# Patient Record
Sex: Female | Born: 1970 | Race: White | Hispanic: No | Marital: Married | Smoking: Former smoker
Health system: Southern US, Community
[De-identification: ages and names within clinical notes are randomized; demographics above are authoritative.]

## PROBLEM LIST (undated history)

## (undated) DIAGNOSIS — E785 Hyperlipidemia, unspecified: Secondary | ICD-10-CM

## (undated) DIAGNOSIS — G902 Horner's syndrome: Secondary | ICD-10-CM

## (undated) DIAGNOSIS — I739 Peripheral vascular disease, unspecified: Secondary | ICD-10-CM

## (undated) DIAGNOSIS — F419 Anxiety disorder, unspecified: Secondary | ICD-10-CM

## (undated) DIAGNOSIS — I472 Ventricular tachycardia: Secondary | ICD-10-CM

## (undated) DIAGNOSIS — I4729 Other ventricular tachycardia: Secondary | ICD-10-CM

## (undated) DIAGNOSIS — I255 Ischemic cardiomyopathy: Secondary | ICD-10-CM

## (undated) DIAGNOSIS — Z87891 Personal history of nicotine dependence: Secondary | ICD-10-CM

## (undated) DIAGNOSIS — I2510 Atherosclerotic heart disease of native coronary artery without angina pectoris: Secondary | ICD-10-CM

## (undated) HISTORY — DX: Ventricular tachycardia: I47.2

## (undated) HISTORY — DX: Peripheral vascular disease, unspecified: I73.9

## (undated) HISTORY — DX: Ischemic cardiomyopathy: I25.5

## (undated) HISTORY — PX: ABDOMINAL HYSTERECTOMY: SHX81

## (undated) HISTORY — DX: Hyperlipidemia, unspecified: E78.5

## (undated) HISTORY — DX: Atherosclerotic heart disease of native coronary artery without angina pectoris: I25.10

## (undated) HISTORY — PX: CAROTID ARTERY - SUBCLAVIAN ARTERY BYPASS GRAFT: SUR178

## (undated) HISTORY — PX: CHOLECYSTECTOMY: SHX55

## (undated) HISTORY — DX: Horner's syndrome: G90.2

## (undated) HISTORY — DX: Personal history of nicotine dependence: Z87.891

## (undated) HISTORY — DX: Other ventricular tachycardia: I47.29

## (undated) HISTORY — DX: Anxiety disorder, unspecified: F41.9

---

## 2000-06-23 ENCOUNTER — Other Ambulatory Visit: Admission: RE | Admit: 2000-06-23 | Discharge: 2000-06-23 | Payer: Self-pay | Admitting: Obstetrics and Gynecology

## 2000-08-20 ENCOUNTER — Ambulatory Visit (HOSPITAL_COMMUNITY): Admission: RE | Admit: 2000-08-20 | Discharge: 2000-08-20 | Payer: Self-pay | Admitting: Obstetrics and Gynecology

## 2000-10-12 ENCOUNTER — Encounter (INDEPENDENT_AMBULATORY_CARE_PROVIDER_SITE_OTHER): Payer: Self-pay

## 2000-10-12 ENCOUNTER — Inpatient Hospital Stay (HOSPITAL_COMMUNITY): Admission: RE | Admit: 2000-10-12 | Discharge: 2000-10-14 | Payer: Self-pay | Admitting: Obstetrics and Gynecology

## 2001-04-16 ENCOUNTER — Other Ambulatory Visit: Admission: RE | Admit: 2001-04-16 | Discharge: 2001-04-16 | Payer: Self-pay | Admitting: Obstetrics and Gynecology

## 2001-05-12 ENCOUNTER — Encounter (INDEPENDENT_AMBULATORY_CARE_PROVIDER_SITE_OTHER): Payer: Self-pay

## 2001-05-12 ENCOUNTER — Inpatient Hospital Stay (HOSPITAL_COMMUNITY): Admission: RE | Admit: 2001-05-12 | Discharge: 2001-05-14 | Payer: Self-pay | Admitting: Obstetrics and Gynecology

## 2001-11-14 ENCOUNTER — Inpatient Hospital Stay (HOSPITAL_COMMUNITY): Admission: EM | Admit: 2001-11-14 | Discharge: 2001-11-17 | Payer: Self-pay | Admitting: Family Medicine

## 2001-12-07 ENCOUNTER — Encounter (INDEPENDENT_AMBULATORY_CARE_PROVIDER_SITE_OTHER): Payer: Self-pay | Admitting: Specialist

## 2001-12-08 ENCOUNTER — Inpatient Hospital Stay (HOSPITAL_COMMUNITY): Admission: RE | Admit: 2001-12-08 | Discharge: 2001-12-10 | Payer: Self-pay | Admitting: *Deleted

## 2005-09-14 ENCOUNTER — Emergency Department (HOSPITAL_COMMUNITY): Admission: EM | Admit: 2005-09-14 | Discharge: 2005-09-15 | Payer: Self-pay | Admitting: Emergency Medicine

## 2007-04-04 ENCOUNTER — Ambulatory Visit (HOSPITAL_COMMUNITY): Admission: RE | Admit: 2007-04-04 | Discharge: 2007-04-04 | Payer: Self-pay | Admitting: Family Medicine

## 2008-10-30 ENCOUNTER — Ambulatory Visit: Payer: Self-pay | Admitting: Surgery

## 2008-10-31 ENCOUNTER — Encounter: Admission: RE | Admit: 2008-10-31 | Discharge: 2008-10-31 | Payer: Self-pay | Admitting: Surgery

## 2008-11-06 ENCOUNTER — Ambulatory Visit: Payer: Self-pay | Admitting: Surgery

## 2008-11-10 ENCOUNTER — Emergency Department (HOSPITAL_COMMUNITY): Admission: EM | Admit: 2008-11-10 | Discharge: 2008-11-10 | Payer: Self-pay | Admitting: Emergency Medicine

## 2008-11-13 ENCOUNTER — Ambulatory Visit: Payer: Self-pay | Admitting: Surgery

## 2008-11-17 ENCOUNTER — Ambulatory Visit: Payer: Self-pay | Admitting: Surgery

## 2008-11-17 ENCOUNTER — Inpatient Hospital Stay (HOSPITAL_COMMUNITY): Admission: RE | Admit: 2008-11-17 | Discharge: 2008-11-18 | Payer: Self-pay | Admitting: Surgery

## 2008-12-04 ENCOUNTER — Ambulatory Visit: Payer: Self-pay | Admitting: Surgery

## 2010-09-15 ENCOUNTER — Encounter: Payer: Self-pay | Admitting: Obstetrics and Gynecology

## 2010-10-22 IMAGING — CT CT ANGIO CHEST
2 of 9 series · 15 of 36 positions shown · IV contrast ([ID] OMNI 350)
Comparison: None

CLINICAL DATA: Finger infarct.  Asymmetric upper extremity
pressures.

CT ANGIOGRAPHY CHEST,CT ANGIOGRAPHY UPPER LEFT EXTREMITY
CT ANGIOGRAM LEFT UPPER EXTREMITY
TECHNIQUE: Multidetector CT imaging of the chest and left upper
extremity using the standard protocol during bolus administration
of intravenous contrast. Multiplanar reconstructed images including
MIPs were obtained and reviewed to evaluate the vascular anatomy.
Contrast: 159 ml Omnipaque 300 IV

[Series 2: non contrast · axial · 0.88mm/px · z∈[-181,+514]mm · 7 of 187 slices shown]
[im 24/187  lung]
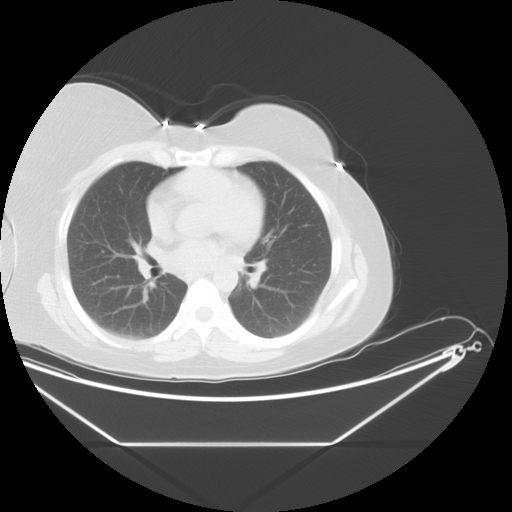
[im 47/187  mediastinal]
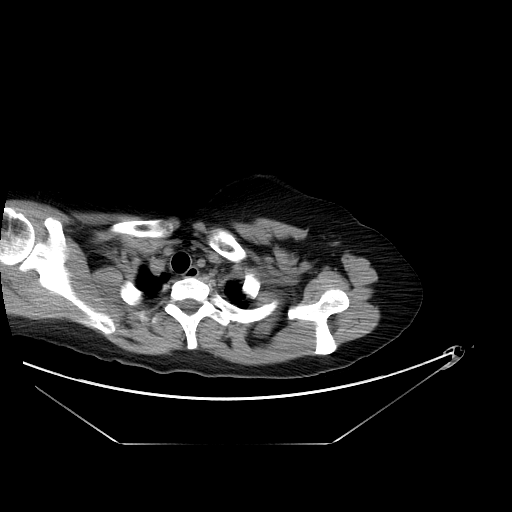
[im 70/187  lung]
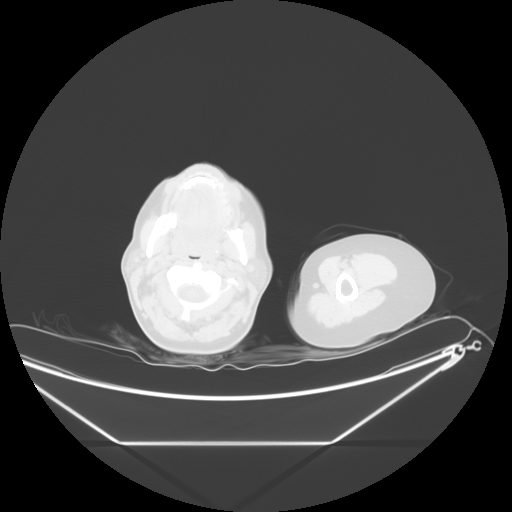
[im 94/187  mediastinal]
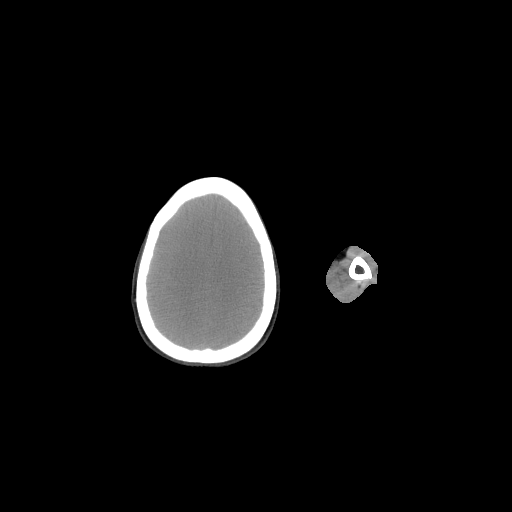
[im 117/187  lung]
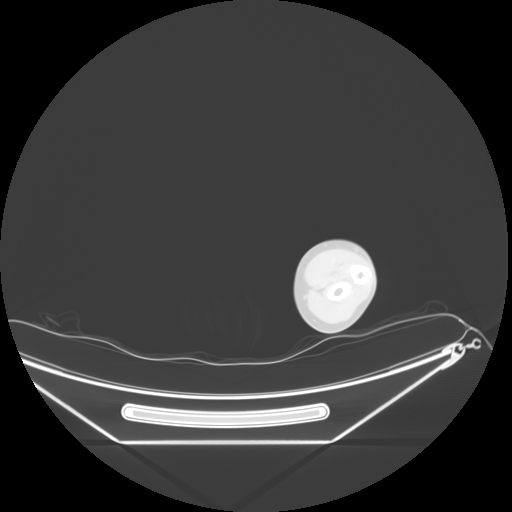
[im 140/187  mediastinal]
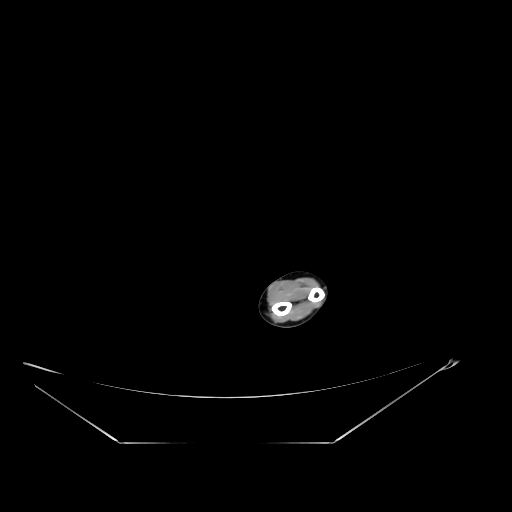
[im 163/187  lung]
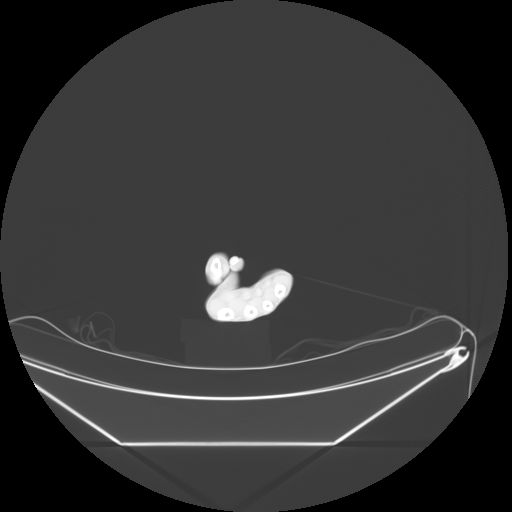

[Series 5: angio · axial · 0.75mm/px · z∈[-218,+560]mm · 8 of 357 slices shown]
[im 23/357  lung]
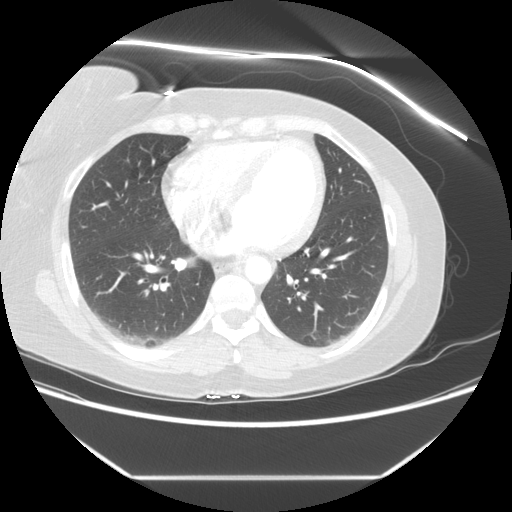
[im 67/357  lung]
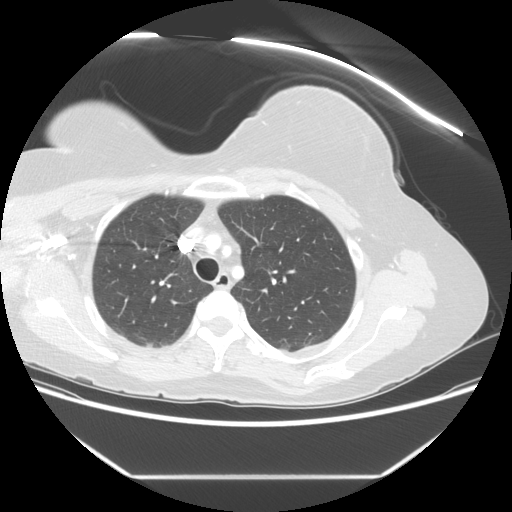
[im 112/357  lung]
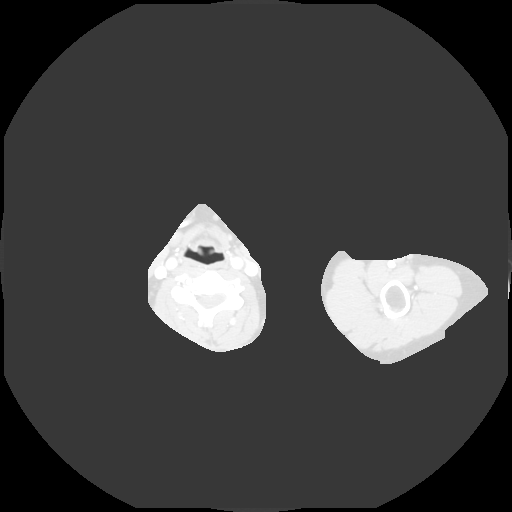
[im 156/357  lung]
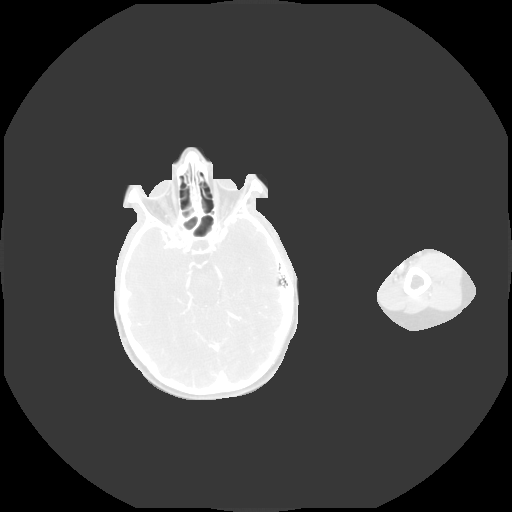
[im 201/357  lung]
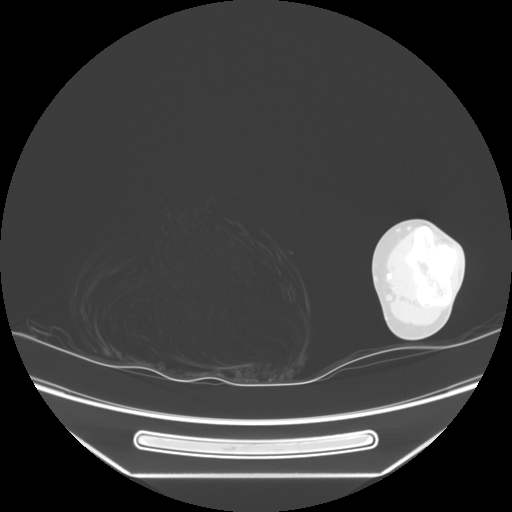
[im 245/357  lung]
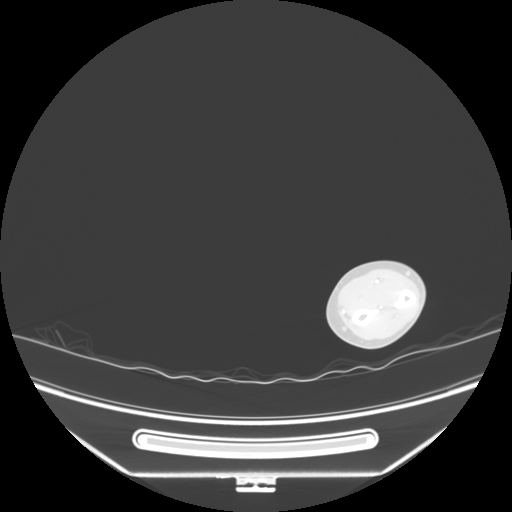
[im 290/357  lung]
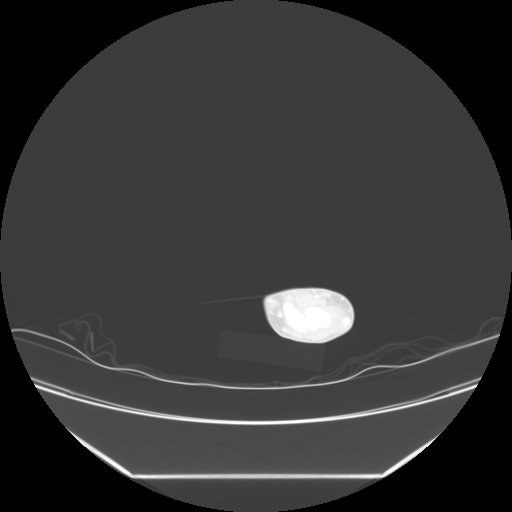
[im 334/357  lung]
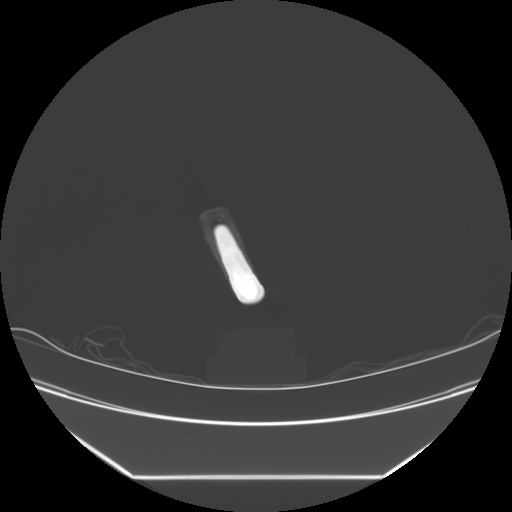

[15 of 36 positions shown; findings below may reference images not displayed]

FINDINGS: The noncontrast scout shows no significant atheromatous
calcifications or hyperdense hematoma.

On CTA, there is good contrast opacification of the thoracic aorta
which is normal in caliber and contour with some mild atheromatous
eccentric noncalcified plaque   noted in the distal descending
portion just above the diaphragm.  There is classic brachiocephalic
arterial anatomy. There is a short segment irregular shelf-like
plaque at the origin of the left subclavian artery extending over a
length of less than 1 cm.  The more distal aspect of the left
subclavian artery is unremarkable.  Left axillary, brachial,
radial, ulnar, and interosseous arteries are unremarkable. Both the
radial and ulnar arteries are patent across the wrist to supply the
hand. Venous phase was not obtained.
The carotid bifurcations are incidentally included on the scan and
demonstrate no significant plaque or stenosis.

The lung windows show minimal dependent atelectasis in both lungs
with no focal infiltrate or nodule.  There is no pleural or
pericardial effusion.  No hilar or mediastinal adenopathy.
IMPRESSION: 1.  Short segment shelf-like plaque   at the left subclavian artery
origin, possibly a source of hemodynamically significant
narrowing and/or emboli.

## 2010-12-05 LAB — CBC
HCT: 36.5 % (ref 36.0–46.0)
HCT: 39.8 % (ref 36.0–46.0)
Hemoglobin: 12.4 g/dL (ref 12.0–15.0)
Hemoglobin: 13.7 g/dL (ref 12.0–15.0)
MCHC: 33.9 g/dL (ref 30.0–36.0)
MCV: 89.7 fL (ref 78.0–100.0)
MCV: 90.3 fL (ref 78.0–100.0)
Platelets: 403 10*3/uL — ABNORMAL HIGH (ref 150–400)
RBC: 4.04 MIL/uL (ref 3.87–5.11)
RDW: 13.2 % (ref 11.5–15.5)
RDW: 13.5 % (ref 11.5–15.5)

## 2010-12-05 LAB — TYPE AND SCREEN: Antibody Screen: NEGATIVE

## 2010-12-05 LAB — BASIC METABOLIC PANEL
CO2: 25 mEq/L (ref 19–32)
Chloride: 103 mEq/L (ref 96–112)
GFR calc Af Amer: >60 mL/min (ref >60)
Glucose, Bld: 119 mg/dL — ABNORMAL HIGH (ref 70–99)
Potassium: 3.3 mEq/L — ABNORMAL LOW (ref 3.5–5.1)
Sodium: 136 mEq/L (ref 135–145)

## 2010-12-05 LAB — COMPREHENSIVE METABOLIC PANEL
Albumin: 3.8 g/dL (ref 3.5–5.2)
Alkaline Phosphatase: 104 U/L (ref 39–117)
BUN: 9 mg/dL (ref 6–23)
Chloride: 106 mEq/L (ref 96–112)
Creatinine, Ser: 0.49 mg/dL (ref 0.4–1.2)
Glucose, Bld: 84 mg/dL (ref 70–99)
Total Bilirubin: 0.3 mg/dL (ref 0.3–1.2)
Total Protein: 6.6 g/dL (ref 6.0–8.3)

## 2010-12-05 LAB — PROTIME-INR
INR: 1 (ref 0.00–1.49)
Prothrombin Time: 12.8 seconds (ref 11.6–15.2)

## 2010-12-05 LAB — URINALYSIS, ROUTINE W REFLEX MICROSCOPIC
Bilirubin Urine: NEGATIVE
Hgb urine dipstick: NEGATIVE
Nitrite: NEGATIVE
Protein, ur: NEGATIVE mg/dL
Specific Gravity, Urine: 1.022 (ref 1.005–1.030)
Urobilinogen, UA: 0.2 mg/dL (ref 0.0–1.0)

## 2010-12-05 LAB — APTT: aPTT: 30 seconds (ref 24–37)

## 2011-01-07 NOTE — Assessment & Plan Note (Signed)
OFFICE VISIT   Shannon [name], Shannon [name]  DOB:  1970/10/10                                       12/04/2008  EAVWU#:[id number]   REASON FOR VISIT:  Follow-up.   HISTORY:  This is a 40 year old female who initially presented with  ischemic finger. Embolic workup revealed a proximal subclavian lesion.  She is also found to have an abnormal lupus anticoagulant panel. She  underwent left subclavian to carotid artery transposition on November 17, 2008.  She comes in for follow-up.  She has complaints of anisocoria and  the lack of sweating on the right side of her face.  Otherwise she is  not having any symptoms.   PHYSICAL EXAMINATION:  On physical examination she does have a healing  ridge under her incision.  The incision is well-healed.  She is a  palpable left radial pulse.  The left pupil is slightly smaller than the  right.   PLAN:  I plan on seeing the patient back in 6 months.  She has done very  well from her operation.  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  I have reassured her of this.  She is going to come back to see  Dr. Cheryll Cockayne in the near future to evaluate her finger.  I am going to  send her to Rheumatology for discussions regarding the need for  anticoagulation given her embolic episode and her findings of a positive  lupus anticoagulant on hypercoagulable workup.  I am going to see her  back in 6 months with an ultrasound.   Jorge Ny, MD  Electronically Signed   VWB/MEDQ  D:  12/04/2008  T:  12/05/2008  Job:  [id number]   cc:   Elvina Sidle, M.D.  Jonelle Sports. Cheryll Cockayne, M.D.

## 2011-01-07 NOTE — Assessment & Plan Note (Signed)
OFFICE VISIT   Shannon [name], Shannon [name]  DOB:  21-Oct-1970                                       11/06/2008  ZOXWR#:[id number]   REASON FOR VISIT:  Ischemic finger.   HISTORY:  This is a 40 year old female I am seeing at the request Dr.  Teressa Senter for evaluation of ischemic finger.  The patient was sent over  last week for ultrasound and found to have evidence of embolic process.  Therefore, I ordered an embolic workup and she comes in today.  The  patient states that she first began having symptoms of coldness and  bluish discoloration in her first and second toe in January.  Later that  month, she cut her middle finger cooking and never healed.  Due to the  pain and the nonhealing wound on her finger she went see Dr. Teressa Senter.  He  sent her for an ultrasound and that is how she ended up here with me.  Currently, she is placing Silvadene on her finger, she wears her finger  in a sock to keep it warm and she does complain of pain in her first and  second digits.   The patient has a history of early cardiovascular disease in her family.  Her mother had a heart attack in her 29s and her father in his 50s.  She  states that she has a history of high blood pressure and cholesterol  that is not well controlled.  She also smokes 1/2-pack to a pack a day.  She is also taking hormone replacement therapy.   REVIEW OF SYSTEMS:  As above.  All other systems are negative.   PAST MEDICAL HISTORY:  Hypertension and hypercholesterolemia.   PAST SURGICAL HISTORY:  1. Exploratory laparotomy.  2. Removal of endometriosis.  3. Cholecystectomy.  4. Partial hysterectomy.  5. Removal of right ovary.   FAMILY HISTORY:  Positive for cardiovascular disease in her mother and  father at an early age.   SOCIAL HISTORY:  She is married with 3 children.  Smokes approximately  1/2-pack to a pack a day.  Does not drink alcohol.   MEDICATIONS:  Wellbutrin, Norvasc and aspirin.   ALLERGIES:   None.   PHYSICAL EXAMINATION:  Blood pressure is 136/86, pulse 102.  General:  She is well-appearing, in no distress.  HEENT:  Normocephalic,  atraumatic.  Pupils equal.  Sclerae anicteric.  Neck:  Supple, no JVD,  no carotid bruits.  Cardiovascular:  Regular rate and rhythm.  Pulmonary:  Lungs are clear bilaterally.  Abdomen:  Soft, nontender.  Extremities:  Warm and well perfused, there is ulceration on the left  middle finger.  There is eschar on the tip of it.  No infection is  noted.   DIAGNOSTIC STUDIES:  The patient has had a cardiac echocardiogram which  is negative for embolic source.  She has a CT angiogram of the chest and  right arm which reveals a short segment shelf-like plaque at the origin  of the left subclavian artery which could potentially be the source of  her embolic process.   ASSESSMENT/PLAN:  Ischemic left finger.   Plan:  The patient needs to complete her embolic workup which will  consist of a blood panel.  She is going to get that done this week and  come back to see me next week, at which  time we will discuss plans for  revascularization.  We briefly talked about carotid subclavian bypass  vs. stenting.  I am leaning towards performing a carotid subclavian  bypass.  This will be done because she is not able to heal the ulcer on  her finger.  She does not have any neurologic symptoms or  vertebrobasilar insufficiency.  We have stopped her hormone replacement  therapy.  I have had an extensive conversation with her regarding  smoking cessation.  She has been started on aspirin.  Again, I will see  her next week at which time will plan for her operation; I do not see  the need to place her on anticoagulation at this time.   Jorge Ny, MD  Electronically Signed   VWB/MEDQ  D:  11/06/2008  T:  11/07/2008  Job:  [id number]   cc:   Katy Fitch. Sypher, M.D.  Elvina Sidle, M.D.

## 2011-01-07 NOTE — Procedures (Signed)
CAROTID DUPLEX EXAM   INDICATION:  Preop left carotid-SCA bypass graft.   HISTORY:  Diabetes:  No.  Cardiac:  No.  Hypertension:  Yes.  Smoking:  Yes.  Previous Surgery:  No.  CV History:  No.  Amaurosis Fugax No, Paresthesias No, Hemiparesis No.                                       RIGHT             LEFT  Brachial systolic pressure:         138               120  Brachial Doppler waveforms:         Triphasic         Biphasic  Vertebral direction of flow:        Antegrade         To and fro  DUPLEX VELOCITIES (cm/sec)  CCA peak systolic                   84                117  ECA peak systolic                   76                89  ICA peak systolic                   87                102  ICA end diastolic                   41                43  PLAQUE MORPHOLOGY:                  None  Homogeneous/intimal thickening  PLAQUE AMOUNT:                      None              Mild  PLAQUE LOCATION:                    None              Bifurcation only   IMPRESSION:  1. Right ICA shows no evidence of stenosis.  2. Left ICA shows no evidence of stenosis; however, left bifurcation      shows evidence of 1% to 19% stenosis with homogeneous plaque vs.      Intimal thickening.  3. Left vertebral artery appears to and fro, right is antegrade.  4. Known brachial pressure variance.   ___________________________________________  V. Charlena Cross, MD   AS/MEDQ  D:  11/06/2008  T:  11/06/2008  Job:  [id number]

## 2011-01-07 NOTE — Op Note (Signed)
NAMECAROLINE, LONGIE                ACCOUNT NO.:  [id number]   MEDICAL RECORD NO.:  [id number]          PATIENT TYPE:  OIB   LOCATION:  3301                         FACILITY:  [facility]   PHYSICIAN:  Juleen China IV, MDDATE OF BIRTH:  1970-10-01   DATE OF PROCEDURE:  11/17/2008  DATE OF DISCHARGE:                               OPERATIVE REPORT   PREOPERATIVE DIAGNOSIS:  Left subclavian stenosis.   POSTOPERATIVE DIAGNOSIS:  Left subclavian stenosis.   PROCEDURE PERFORMED:  Left subclavian to carotid transposition.   SURGEON:  1. Charlena Cross, MD   ASSISTANT:  Quita Skye. Hart Rochester, MD   ANESTHESIA:  General.   BLOOD LOSS:  Minimal.   DRAINS:  None.   CULTURES:  None.   SPECIMENS:  None.   INDICATIONS:  This is a 40 year old female who initially presented with  an ischemic left finger.  Embolic workup revealed a proximal left  subclavian lesion.  She comes in today for a repair.   PROCEDURE:  The patient was identified in the holding area and taken to  room 6.  She was placed supine on the table.  General endotracheal  anesthesia was administered.  The patient was prepped and draped in  standard sterile fashion.  A time-out was called and antibiotics were  administered.  An incision was made from the midline lateral about one  fingerbreadth above the clavicle for a distance of 4 cm.  Cautery was  used to divide the subcutaneous tissue.  The platysma muscle was divided  with Bovie cautery.  Next, the sternal head of the sternocleidomastoid  was encountered and divided with a Bovie cautery.  Weitlaner retractor  was used to aid with exposure.  Next, the carotid sheath was identified.  It was opened sharply.  A plane was developed between the left common  carotid artery and the left internal jugular vein.  The left vagus nerve  was visualized and kept associated with the jugular vein.  Next, baby  Weitlaner was placed between the carotid and the internal jugular vein.  The  common carotid artery was then mobilized as proximal as possible.  The thoracic duct was clearly visualized and divided between 2-0 silk  ties.  Next, the vertebral artery was identified, it was very prominent.  It was traced back down to its origin at the subclavian artery.  The  subclavian artery was then circumferentially dissected free and  encircled with a vessel loop.  Cephalad retraction was then performed in  order to expose the most proximal portion of the subclavian artery.  Next, the subclavian artery was mobilized out past the thyrocervical  trunk.  It was encircled with a vessel loop.  The vertebral artery was  also encircled with a vessel loop.  At this point in time, the patient  was given systemic heparinization.  After the heparin was circulated, a  Henley clamp was placed proximal on the subclavian artery.  Peripheral  DeBakey was placed on the distal subclavian artery and serrefine was  placed on the vertebral artery.  A #11 blade was used to make a stab  in  the subclavian artery proximal to the vertebral artery.  Metzenbaum  scissors were used to transect the subclavian artery about a centimeter  proximal to the vertebral takeoff.  The stump of the subclavian artery  was oversewn with a 5-0 Prolene, first with a horizontal mattress  followed by baseball stitch coming back.  The Henley clamp was released.  The stump of the subclavian artery was hemostatic.  The suture was  transected leaving approximately a 3-cm tail.  Next, we set up for  reimplantation of the subclavian artery into the left carotid artery.  The clamps on the subclavian and vertebral artery were released now  allowing it to back fill and distend.  The site was selected on the  proximal left common carotid artery, this was marked with an ink pen.  The common carotid artery, vertebral artery, and subclavian artery were  then reoccluded.  The site selected on the left common carotid artery  was on the  posterolateral side, it was oriented for better exposure with  the clamps.  A #11 blade was used to make an arteriotomy and #5 punch  was used to create a larger opening.  Next, the end of the subclavian  artery was turned slightly and end-to-side anastomosis was then created  using a running 6-0 Prolene on the CC needle.  Prior completion of the  anastomosis, common carotid artery was flushed in antegrade and  retrograde fashion.  The vertebral artery was flushed and subclavian  artery was flushed.  The anastomosis was then secured.  Clamps were  released first on the common carotid artery followed by the subclavian  artery and then the distal carotid and the vertebral artery were  released.  Doppler was used to evaluate signals and all vessels had  excellent signals.  Next, the heparin was reversed with 50 mg of  protamine.  Reinspected everything for hemostasis which was adequate.  I  did leave Vitasure within the wound near the stump of the subclavian  artery.  Next, the sternal head of the sternocleidomastoid, which had  been divided was reapproximated with a 2-0 Vicryl.  The platysma muscle  was reapproximated with 3-0 Vicryl and the skin was closed with 4-0  Vicryl and Dermabond was placed.  The patient was successfully awakened  from anesthesia, found to be moving all 4 extremities to command.  She  was taken to recovery room in stable condition.           ______________________________  V. Charlena Cross, MD  Electronically Signed     VWB/MEDQ  D:  11/17/2008  T:  11/18/2008  Job:  [id number]

## 2011-01-07 NOTE — Assessment & Plan Note (Signed)
OFFICE VISIT   OCTAVIE, WESTERHOLD  DOB:  [birth year]                                       11/13/2008  NFAOZ#:[id number]   REASON FOR VISIT:  Ischemic left finger.   Please see my note from March 15 for full details.  Briefly, this is a  40 year old female who presented with an ischemic finger.  On embolic  workup she was found to have a proximal subclavian lesion.  I saw her  over the weekend for color changes in her left arm and she continued to  have a palpable pulse and I think this was not significant.  She comes  in today for surgical consideration.  She has had no new changes in her  examination.  She is afebrile, hemodynamically stable.  She still has an  ulcer on her left finger.  She has palpable radial pulse.   ASSESSMENT:  Left subclavian stenosis with embolization.   PLAN:  The patient has been scheduled for a left subclavian  transposition.  This is going to be performed Friday, March 26.  Patient  has undergone carotid duplex preoperatively which shows minimal disease  in her left carotid.  She has to-and-fro flow in her left vertebral  artery, likely secondary to her proximal stenosis.  I discussed risks  and benefits of the surgery including stroke, Horner's syndrome.  She  understands all these risks and we are set to proceed.  At the time of  her operation, she has a very low pain threshold.  I told her I would  make sure I use local anesthesia on the skin at the end of the case.  She also has an intolerance to morphine so will not give her morphine.  She was found to have abnormal lupus anticoagulant on her blood work.  I  will have Hematology see her while she is in the hospital.   Jorge Ny, MD  Electronically Signed   VWB/MEDQ  D:  11/13/2008  T:  11/14/2008  Job:  [id number]   cc:   Katy Fitch. Sypher, M.D.  Elvina Sidle, M.D.

## 2011-01-07 NOTE — Discharge Summary (Signed)
[name], Shannon [name]                ACCOUNT NO.:  [id number]   MEDICAL RECORD NO.:  [id number]          PATIENT TYPE:  OIB   LOCATION:  3301                         FACILITY:  [facility]   PHYSICIAN:  Juleen China IV, MDDATE OF BIRTH:  1970-10-02   DATE OF ADMISSION:  11/17/2008  DATE OF DISCHARGE:  11/18/2008                               DISCHARGE SUMMARY   ADMISSION DIAGNOSIS:  Left subclavian artery stenosis with embolization.   FINAL DISCHARGE DIAGNOSES:  1. Left subclavian stenosis with embolization status post left      subclavian-to-carotid transposition.  2. History of pelvic endometriosis.  3. History of tubal ligation.  4. History of total abdominal hysterectomy and left salpingo-      oophorectomy.  5. History of laparoscopic cholecystectomy and drainage of multiple      liver abscesses in 2003.  6. Ongoing tobacco abuse.  7. History of depression/anxiety.  8. Mild postoperative hypokalemia, supplemented.   PROCEDURES:  November 17, 2008, left subclavian-to-carotid transposition by  Dr. Juleen China.   BRIEF HISTORY:  Shannon [name] is a 40 year old female who initially  presented with ischemic left finger.  A workup revealed proximal left  subclavian lesion.  Dr. Myra Gianotti recommended that she undergo left  subclavian-to-carotid transposition.   HOSPITAL COURSE:  On November 17, 2008, Shannon [name] was electively admitted  to [facility].  She underwent a previously mentioned procedure.  Postoperatively, she was extubated, neurologically intact and after a  short stay in the recovery unit was transferred to step-down unit 3300  where she remained until discharge.  She had an uneventful postoperative  course.  She remained hemodynamically stable.  Her postoperative labs  showed a sodium of 136, potassium of 3.3, glucose of 119, BUN of 5, and  creatinine 0.54.  White count of 10.4, hemoglobin of 12.4, hematocrit  36.5, and platelet count of 339.  She had a 3+ radial  pulse __________  well.  She gets slight fullness of her supraclavicular region, but the  area was soft.  Later that morning, her diet was advanced.  She was able  to ambulate, void without difficulty and ultimately felt appropriate for  discharge home on postoperative day 1, November 18, 2008.  She was felt to  be in stable and improving condition.   DISCHARGE MEDICATIONS:  1. Aspirin 81 mg p.o. daily.  2. Wellbutrin 150 mg daily.  3. Percocet 5/325 mg one to two tablets p.o. q.4 h. p.r.n. pain.  4. Xanax 0.5 mg p.o. p.r.n. anxiety to continue per her home regimen.   DISCHARGE INSTRUCTIONS:  She should continue a heart-healthy diet, may  shower on November 19, 2008, and clean her incisions gently with soap and  water.  Avoid driving and heavy lifting for the next couple of weeks.  She will see Dr. Myra Gianotti in approximately 2 weeks.  She should call  sooner if she has fever greater than 101, redness or drainage from  incision site, or neurologic changes.      Jerold Coombe, P.A.      Jorge Ny, MD  Electronically Signed    AWZ/MEDQ  D:  11/22/2008  T:  11/23/2008  Job:  [id number]   cc:   Elvina Sidle, M.D.  Katy Fitch Sypher, M.D.

## 2011-01-10 NOTE — H&P (Signed)
[facility]  Patient:    Shannon [name], Shannon [name]                         MRN: [id number] Adm. Date:  10/12/00 Attending:  Nena Jordan A. Cherly Hensen, M.D.                         History and Physical  DATE OF BIRTH:  02/11/1971.  CHIEF COMPLAINT:  Severe dysmenorrhea, left ovarian mass, history of pelvic endometriosis.  HISTORY OF PRESENT ILLNESS:  This is a 40 year old gravida 3, para 2-0-1-2, married white female, last menstrual period of September 19, 2000, with a history of tubal ligation, who is now being admitted for exploratory laparotomy, removal of a left ovarian mass and excision of endometriotic implants.  The patient has had severe left-sided pain for which she has intermittently required narcotics and has used nonsteroidals.  Her left-sided pain has been predominantly associated with her menses and she also has had dyspareunia with deep entry.  Bowel movements have been normal.  No tenesmus. Patient underwent a diagnostic laparoscopy secondary to severe dysmenorrhea in December of 2001.  The findings at the time of the surgery were pelvic endometriosis and a left ovarian mass adjacent to the left ovary which was attached to the left posterior fossa and was felt not to be amenable to surgical removal through the laparoscope at the time of the prior surgery. The patient is now presenting for further evaluation and management.  ALLERGIES:  No known drug allergies.  MEDICINES:  Orudis for back pain, Excedrin Migraine tablets, Darvocet-N p.r.n.  PAST MEDICAL HISTORY:  Pelvic endometriosis diagnosed by diagnostic laparoscopy, December 2001.  PAST SURGICAL HISTORY:  LEEP in 1996; tubal ligation approximately four years ago; wisdom tooth extraction; D&C; diagnostic laparoscopy, December of 2001.  OBSTETRICAL HISTORY:  Vaginal deliveries x 2.  Elective termination x 1.  FAMILY HISTORY:  Grandfather -- multiple myeloma; diabetes in grandparents, both  paternal and maternal sides, as well as hypertension.  No genital, colon or breast cancer.  SOCIAL HISTORY:  Married, two children.  Positive smoker.  Works at [employer].  REVIEW OF SYSTEMS:  Positive for migraine headaches twice a week, unrelated to her cycles, and PMS approximately one week pre-menses; otherwise, negative.  PHYSICAL EXAMINATION  GENERAL:  Well-developed, well-nourished white female in no acute distress.  VITAL SIGNS:  Blood pressure 132/80.  Weight is 160 pounds.  Height is 5 feet 3 inches.  SKIN:  No lesions.  HEENT:  Anicteric sclerae.  Pink conjunctivae.  Oropharynx negative.  HEART:  Regular rate and rhythm without murmur.  BREASTS:  Soft, nontender.  No palpable mass.  LUNGS:  Clear to auscultation.  NODES:  No palpable supraclavicular or axillary nodes.  ABDOMEN:  Soft and nontender with healed infraumbilical and suprapubic scars.  PELVIC:  Vulva shows no lesions.  Vagina had blood in the vault.  Cervix was no lesion.  Uterus was retroflexed, nontender.  Right adnexa nontender, no palpable mass; left adnexa -- a palpable fixed tender mass.  EXTREMITIES:  No edema.  IMPRESSION:  Left lower quadrant pain, left ovarian mass and known history of pelvic endometriosis.  PLAN:  Admission, exploratory laparotomy, removal of the adjacent left ovarian mass and freeing up the left ovary as well as excision of the left endometrial implants that are present.  Antibiotic prophylaxis.  Pneumatic boots.  Risks and benefits of the procedure have been reviewed  with the patient including, but not limited to, infection, bleeding, internal scar tissue, injury to surrounding organ structures, possible loss of the left ovary, recurrence and/or persistence of the endometriosis in the future and all questions answered. DD:  10/12/00 TD:  10/12/00 Job: [id number]

## 2011-01-10 NOTE — H&P (Signed)
[facility]  Patient:    Shannon [name], Shannon [name] Visit Number: [id number] MRN: [id number]          Service Type: GYN Location: [phone] Attending Physician:  Maxie Better Dictated by:   Sheria Lang. Cherly Hensen, M.D. Admit Date:  05/12/2001                           History and Physical  CHIEF COMPLAINT:              Persistent left lower quadrant pain, pelvic endometriosis.  HISTORY OF PRESENT ILLNESS:   This is a 40 year old gravida 3, para 2-0-1-2, female with a history of tubal ligation and a history of pelvic endometriosis, who is now being admitted for a TAH/LSO secondary to persistent left lower quadrant pain with associated dyspareunia.  The patient has been on Lupron Depot therapy without any change in her symptomatology.  Her history is notable for diagnostic laparoscopy in December 2001, which at that time showed pelvic endometriosis, abdominopelvic adhesions, left ovarian mass.  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  The patient now presents for further surgical management.  She requests that her right tube and ovary remain since she has had no problems with that right side, but would like to proceed with the planned surgery.  The patient has had hot flashes due to her Lupron Depot, and her last dose of Lupron Depot was May 04, 2001, as the surgery was previously scheduled for October and now is moved up.  She has had ongoing dyspareunia, and the patient has had headaches secondary to the absence of her menses, for which she was evaluated by the [facility].  ALLERGIES:                     No known drug allergies.  MEDICATIONS:                  Lupron Depot injection, last dose on May 04, 2001.  Fioricet p.r.n.  Excedrin Migraine p.r.n.  Hydrocodone p.r.n.  PAST MEDICAL HISTORY:         Pelvic endometriosis, chronic left lower quadrant pain.  PAST SURGICAL HISTORY:        Diagnostic laparoscopy in 2001.  In 2002, exploratory laparotomy, left ovarian cystectomy, excision of endometriotic implant, suspension of the left ovary.  LEEP procedure.  Tubal ligation. Wisdom tooth extraction.  D&C.  OBSTETRICAL HISTORY:          Vaginal delivery x 2, TAB x 1.  FAMILY HISTORY:               Multiple myeloma, grandfather.  Diabetes, grandparents, paternal aunt.  Hypertension, grandparents.  No genital, colon, or breast cancer.  REVIEW OF SYSTEMS:            Dyspareunia as noted in the HPI.  Migraines twice a week, may or may not be related to her cycles.  PMS symptoms one week prior to her menses.  All other review of systems negative.  PHYSICAL EXAMINATION:  VITAL SIGNS:                  Blood pressure 114/88,  weight of 153.  GENERAL:                      A well-developed, well-nourished white female in no acute distress.  SKIN:                         No lesions.  HEENT:                        Anicteric sclerae, pink conjunctivae. Oropharynx fair with false dentition.  CHEST:                        Lungs are clear to auscultation.  CARDIAC:                      Heart was regular rate and rhythm without murmur.  BREASTS:                      Soft, nontender, no palpable mass.  ABDOMEN:                      Soft, nontender.  Keloid, transverse scar.  PELVIC:                       Vulva showed no lesions.  Vagina had no discharge.  Cervix is closed.  Uterus is small, anteverted.  Adnexa minimally tender on the left with no palpable mass.  Right was nontender with no palpable mass.  RECTAL:                       Deferred.  IMPRESSION:                    Chronic left lower quadrant pain, pelvic endometriosis.  PLAN:                         Admission, TAH/LSO, preservation of the right side.  Analgesics postoperatively, antibiotic prophylaxis, pneumatic boots prophylaxis.  Risks and benefits of the procedure have been explained to the patient, including but not limited to infection, bleeding, internal scar tissue, injury to bladder, bowels, ureter, possible need for surgery in the future secondary to presence of right ovarian cyst or ovarian remnant syndrome or, less likely, ovarian cancer, possible need for blood transfusion and if infused, risk of HIV transmission is 1 out of 100,000, hepatitis transmission 1 out of 3000, acute reaction.  Postop care was discussed, criteria for discharge were discussed, all questions answered. Dictated by:   Sheria Lang. Cherly Hensen, M.D. Attending Physician:  Maxie Better DD:  05/12/01 TD:  05/12/01 Job: [id number]

## 2011-01-10 NOTE — Discharge Summary (Signed)
[facility]  Patient:    Shannon [name], Shannon [name] Visit Number: [id number] MRN: [id number]          Service Type: MED Location: [phone] Attending Physician:  Vikki Ports. Dictated by:   Vikki Ports, M.D. Admit Date:  11/13/2001 Discharge Date: 11/17/2001                             Discharge Summary  ADMISSION DIAGNOSES: 1. Acute cholecystitis. 2. Small liver abscesses.  DISCHARGE DIAGNOSES: 1. Acute cholecystitis. 2. Small liver abscesses.  CONDITION ON DISCHARGE:  Good and improved.  DISCHARGE DISPOSITION:  Discharged to home.  DISCHARGE MEDICATIONS: 1. Augmentin 875 mg one p.o. b.i.d. 2. Mepergan Fortis one to two p.o. q.4-6h. p.r.n. pain.  HISTORY OF PRESENT ILLNESS:  The patient is a 40 year old white female referred from Urgent Care for evaluation of abdominal pain.  It started two to three days prior to admission.  She was having nausea, no emesis or diarrhea. The patient underwent CT scan in the emergency room, which showed significant cholelithiasis, thickening of the gallbladder wall, and a pericholecystic fluid collection within the liver consistent with a liver abscess.  There was also a second liver collection more laterally subcapsular in the liver as well.  The patient was very tender on examination, and her white count was 14,000.  Liver function tests were essentially normal.  HOSPITAL COURSE:  The patient was started on IV antibiotics and after a long discussion with her and radiology, we held off on aspirating the liver abscesses as patient had significant clinical improvement over the following two days.  She became afebrile, white count returned to normal at 9000, and by hospital day #3 she was tolerating a regular diet with minimal right upper quadrant pain.  FINAL DIAGNOSIS:  Acute cholecystitis with liver abscess.  PLAN:  Continued home antibiotics with Augmentin 875 mg p.o. t.i.d., follow-up with me  in the office in two weeks, and schedule elective laparoscopic cholecystectomy within a month or so. Dictated by:   Vikki Ports, M.D. Attending Physician:  Danna Hefty R. DD:  11/17/01 TD:  11/18/01 Job: [id number]

## 2011-01-10 NOTE — Discharge Summary (Signed)
[facility]  Patient:    Shannon [name], Shannon [name] Visit Number: [id number] MRN: [id number]          Service Type: SUR Location: [phone] Attending Physician:  Vikki Ports Dictated by:   Vikki Ports, M.D. Admit Date:  12/07/2001 Discharge Date: 12/10/2001                             Discharge Summary  ADMISSION DIAGNOSIS:  Acute cholecystitis and liver abscesses.  DISCHARGE DIAGNOSIS:  Acute cholecystitis and liver abscesses.  CONDITION ON DISCHARGE:  Good and improved.  DISPOSITION:  Discharged to home.  DISCHARGE MEDICATIONS: 1. Augmentin 875 mg p.o. b.i.d. x 7 days. 2. Percocet one to two p.o. q.4h. p.r.n. pain.  FOLLOW-UP:  Follow-up is with me in four days for removal of her drain.  HISTORY OF PRESENT ILLNESS:  The patient is a 40 year old white female with a month long history of worsening acute cholecystitis and liver abscesses based on CT scan.  The patient was treated nonoperatively with IV and then p.o. antibiotics.  Her pain resolved.  CT showed mild resolution of the abscesses, but continued thin wall edema and pericholecystic fluid.  The patient presents now for elective cholecystectomy, intraoperative cholangiogram, and drainage of liver abscesses.  For the remainder of the H&P, please see the chart.  HOSPITAL COURSE:  The patient was admitted and taken to the operating room. She underwent a very technically difficult laparoscopic cholecystectomy with intraoperative cholangiogram and drainage of liver abscesses.  A JP drain was left in place.  The patient was remained on IV antibiotics for three days postoperatively.  She remained afebrile.  She had some mild serous drainage from her JP, which continued at low volume about 20 cc a day.  She was ready for discharge after taking normal p.o. diet and p.o. pain medications on postoperative day #3. Dictated by:   Vikki Ports, M.D. Attending Physician:   Danna Hefty R. DD:  12/10/01 TD:  12/11/01 Job: [id number]

## 2011-01-10 NOTE — Op Note (Signed)
[facility]  Patient:    [name], Shannon Visit Number: [id number] MRN: [id number]          Service Type: DSU Location: DAY Attending Physician:  Vikki Ports. Dictated by:   Vikki Ports, M.D. Proc. Date: 12/07/01 Admit Date:  12/07/2001                             Operative Report  PREOPERATIVE DIAGNOSIS:  Acute cholecystitis and liver abscess.  POSTOPERATIVE DIAGNOSIS:  Acute cholecystitis and liver abscess.  PROCEDURE:  Laparoscopic cholecystectomy, lysis of adhesions, intraoperative cholangiogram, and drainage of multiple liver abscesses.  SURGEON:  Vikki Ports, M.D.  ASSISTANT:  Anselm Pancoast. Shannon Dakins, M.D.  DESCRIPTION OF PROCEDURE:  The patient was taken to the operating room and placed in the supine position.  After adequate anesthesia was induced using endotracheal tube, the abdomen was prepped and draped in the usual sterile fashion.  Using a transverse infraumbilical incision, I dissected down to the fascia.  It was opened vertically.  An 0 Vicryl pursestring suture was placed around the fascial defect.  The Hasson trocar was placed into the abdomen and the abdomen was insufflated with carbon dioxide.  On laparoscopy, the patient had severe inflammatory changes in the right upper quadrant with densely adhered hepatic flexure of the colon as well as the stomach and omentum to the liver edge.  It was a very tedious dissection requiring a significant amount of time to mobilize the hepatic flexure in the stomach.  The gallbladder was finally identified and partially drained.  It was retracted cephalad.  It was very edematous and inflamed, but after at least an hour to an hour-and-a-half of tedious sharp and blunt dissection, the entire gallbladder could be visualized.  The infundibulum was retracted laterally and the cystic duct was identified.  It was clipped proximally and a ductotomy was made. Cholangiogram was  performed which showed normal anatomy and no filling defects.  The cholangiocatheter was removed.  The cystic duct was then triply clipped and divided.  The cystic artery was also dissected free, triply clipped and divided.  The gallbladder was taken off the gallbladder bed with great difficulty using electrocautery.  The posterior wall of the gallbladder was dilated and a number of stones were expelled.  These were all collected and removed.  The gallbladder was placed in an Endocatch bag and removed through the umbilical port.  The right upper quadrant was copiously irrigated with out 6-7 L of saline solution.  The abscesses were directly posterior to the gallbladder and out laterally.  These were widely opened, drained, and irrigated.  A #19 Blake JP drain was then placed in the gallbladder fossa. The trocars were removed.  After the gallbladder was removed, the fascial defect was closed.  All skin incisions were closed with subcuticular 4-0 Monocryl.  Steri-Strips and sterile dressings were applied.  The patient tolerated the procedure well and went to PACU in good condition. Dictated by:   Vikki Ports, M.D. Attending Physician:  Danna Hefty R. DD:  12/07/01 TD:  12/07/01 Job: [id number]

## 2011-01-10 NOTE — Discharge Summary (Signed)
[facility]  Patient:    Shannon [name], Shannon [name]               MRN: [id number] Adm. Date:  [id number] Disc. Date: [id number] Attending:  Maxie Better                           Discharge Summary  ADMISSION DIAGNOSES: 1. Left lower quadrant pain. 2. Left ovarian mass. 3. History of pelvic endometriosis.  DISCHARGE DIAGNOSES: 1. Left ovarian endometrioma. 2. Pelvic endometriosis with left uterosacral involvement. 3. Pelvic pain.  PROCEDURES:  Examination under anesthesia, exploratory laparotomy, left ovarian cystectomy, suspension of left ovary to left round ligament, excision of endometriotic implants on left uterosacral ligament, distal left salpingectomy.  HOSPITAL COURSE:  The patient was admitted to [facility].  Please see dictated history and physical for specific details.  She was taken to the operating room where she underwent the above-stated procedures.  Findings at the time of surgery showed a left ovary without a mass.  It was adherent to the left uterosacral ligament with the bowel pulled within the endometriosis implant and shortened the left utero-ovarian ligament.  The uterus was retroverted.  Normal right tube and ovary.  Surgical separation of both tubes was noted.  Normal uterine size seen.  Normal appendix.  The left ureter was identified throughout the procedure peristalsing.  The left distal fallopian tube was having persistent area of bleeding and, therefore, was subsequently removed.  The patient tolerated the procedure well.  Her postoperative course was notable for slow bowel function which subsequently responded to glycerin suppository.  The patient remained afebrile throughout her stay.  A CBC on postoperative day #1 showed a white count of 11.1, hematocrit of 33.4, hemoglobin 11.4.  By postoperative day #2 the patient subsequently passed flatus after glycerin suppository, was desirous of going home.  Her  menstrual period started.  She had a temperature max of 100.9; however, she subsequently became afebrile for 24 hours.  The final pathology confirmed a benign ovarian endometriotic cyst.  Biopsies of endometriosis and the fallopian tube with acute serositis and benign peritubal cysts.  The incision had staples that were in place.  No erythema, induration, or exudate.  The patient was deemed well to be discharged home.  DISPOSITION:  Home.  CONDITION ON DISCHARGE:  Stable.  DISCHARGE MEDICATIONS: 1. Dilaudid 2 mg, #30, 1-2 tablets every four to six hours p.r.n. pain. 2. Motrin 800 mg p.o. q.6h. p.r.n. pain. 3. Lupron Depot injection via the office.  FOLLOW-UP:  For staple removal on October 19, 2000, at [facility].  DISCHARGE INSTRUCTIONS:  Call for temperature greater than or equal to 100.4. Nothing per vagina for four to six weeks.  No heavy lifting or driving for two weeks.  Call if increased incisional pain, redness, or drainage from the incision site, severe abdominal pain, nausea, or vomiting. DD:  11/23/00 TD:  11/24/00 Job: [id number]

## 2011-01-10 NOTE — Op Note (Signed)
[facility]  Patient:    Shannon [name], Shannon [name]               MRN: [id number] Proc. Date: 10/12/00 Adm. Date:  [id number] Attending:  Maxie Better                           Operative Report  PREOPERATIVE DIAGNOSES:       1. Pelvic endometriosis.                               2. Left ovarian mass.                               3. Pelvic pain.  POSTOPERATIVE DIAGNOSES:      1. Left ovarian endometrioma.                               2. Pelvic endometriosis with left uterosacral                                  ligament involvement.  OPERATION:                    1. Examination under anesthesia.                               2. Excision of uterosacral endometriotic                                  implants.                               3. Suspension of left ovary.                               4. Distal left salpingectomy.                               5. Left ovarian endometriotic cystectomy.  SURGEON:                      Sheronette A. Cherly Hensen, M.D.  ASSISTANT:                    Silverio Lay, M.D.  ANESTHESIA:                   General.  SPECIMENS:                    Specimen was the left endometriotic ovarian cyst, endometriotic implants and the distal portion of the left fallopian tube.  COMPLICATIONS:                None.  COUNTS:                       Correct x 2.  URINE OUTPUT:  20 cc clear, yellow urine.  ESTIMATED BLOOD LOSS:         150 cc.  INTRAVENOUS FLUIDS:           1700 cc Crystalloid.  DESCRIPTION OF PROCEDURE:     Under adequate general anesthesia, the patient was placed in the supine position.  Examination under anesthesia revealed a retroverted uterus. The right adnexa was without any palpable mass. The left adnexa with a fixed palpable structure. The patient was thoroughly prepped and draped in the usual fashion. The bladder had an indwelling Foley catheter placed sterilely.  A Pfannenstiel  skin incision was then made and carried down to the rectus fascia. The rectus fascia was incised in the midline and extended bilaterally. The rectus fascia was then dissected bluntly and with cautery off the rectus muscle in a superior and inferior fashion. The rectus muscle was split in the midline and the parietoperitoneum was entered bluntly and extended superiorly and inferiorly. The bowels were packed upwardly. A self-retaining retractor was then placed. Inspection of the pelvis revealed no evidence of the previous left ovarian mass which had been adjacent to the left ovary, however, the left ovary which appeared to be normal in size was adherent to the posterior fossa as well as to the left uterosacral ligament with the tenting of the rectosigmoid to this complex. There were small endometriotic implants on the serosa overlying the left ureter. The anterior cul-de-sac was without any endometriosis. The right ovary was normal without any endometriosis and the remaining posterior cul-de-sac was without any other palpable areas of endometriosis. The left ovary was inspected carefully. The left ureter was identified and was noted to be peristalsing in normal caliber and did not appear to be involved with this endometriosis. The left ovary had a shortened utero-ovarian ligament. The left ovary was then with blunt dissection raised from its attachment from the posterior fossa at which time dark chocolate material was then noted to be exuding from the ovary. The decision was then made to open the left retroperitoneal space and dissect down further to delineate the left ureter. This did not help to facilitate the dissection that was necessary for the uterosacral ligament endometriotic implant, therefore, this was not further carried out. With saline injections around the endometriotic implants superficially, the endometrial implants were then sharply dissected which subsequently released the  left uterosacral ligament as well as the tenting of the rectosigmoid. The endometriotic implant overlying the left ureter was also gently removed. The left ovary was then opened further and the endometriotic cyst wall was removed. The ovary was then closed with 3-0 Vicryl sutures. Hemostasis was then accomplished with cautery. Due to the retroflexed position of the uterus, concern was raised regarding the ovary again becoming affixed to the left ovarian fossa and decision was then made to perform a suspension. Initially the space underneath the left round ligament was opened and attempt was made to place the left tube and ovary through that defect, however, due to the shortened left utero-ovarian ligament, this was not felt to be a worthy prospect.  Therefore, the left utero-ovarian ligament was clamped, cut and suture ligated. The portion to the uterus was also suture ligated with #0 Vicryl sutures with good hemostasis noted. The left ovarian ligaments was then attached to the round ligament. The distal fallopian tube which had been previously undergone a tubal ligation was noted to have areas of bleeding and was subsequently removed. The abdomen was copiously irrigated; bleeders were cauterized. Interceed was  placed in the raw surface posteriorly to the uterus. The self-retaining retractors were removed as was all packing. The parietoperitoneum was not closed. The rectus fascia was inspected. The rectus muscle was also inspected and small bleeders cauterized. The rectus fascia was closed with #0 Vicryl x 2. The skin was injected with 0.25% Marcaine and approximated with Ethicon staples. Specimen was the left endometriotic ovarian cyst, endometriotic implants and the distal portion of the left fallopian tube. There were no complications.  Sponge, needle, lap and instrument counts were correct x 2. Urine output was about 20 cc clear, yellow urine.  Estimated blood loss was about 150 cc.  Intraoperative fluids was 1700 cc Crystalloid. The patient tolerated the procedure well and was transferred to the recovery room in stable condition. DD:  10/12/00 TD:  10/13/00 Job: [id number]

## 2011-01-10 NOTE — Op Note (Signed)
[facility]  Patient:    Shannon [name], Shannon [name] Visit Number: [id number] MRN: [id number]          Service Type: GYN Location: 9300 [id number] Attending Physician:  Maxie Better Dictated by:   Sheria Lang. Cherly Hensen, M.D. Proc. Date: 05/12/01 Admit Date:  05/12/2001                             Operative Report  PREOPERATIVE DIAGNOSES:       1. Pelvic pain/chronic left lower quadrant pain.                               2. Pelvic endometriosis.  PROCEDURE:                    1. Exploratory laparotomy.                               2. Total abdominal hysterectomy.                               3. Left salpingo-oophorectomy.                               4. Lysis of adhesions.  POSTOPERATIVE DIAGNOSIS:      1. Pelvic pain/chronic left lower quadrant pain.                               2. Pelvic endometriosis.                               3. Abdominopelvic adhesions.  SURGEON:                      Sheronette A. Cherly Hensen, M.D.  ASSISTANT:                    Silverio Lay, M.D.  ANESTHESIA:                   General.  DESCRIPTION OF PROCEDURE:     Under adequate general anesthesia, the patient was placed in the supine position. She was sterilely prepped and draped in the usual fashion consistent with a hysterectomy. An indwelling Foley catheter was placed. Marcaine 0.25%, 7 cc, was injected along the previous incision line. A Pfannenstiel skin incision was then made, carried down to the rectus fascia using Bovie cautery. The rectus fascia was incised in the midline and extended bilaterally. The rectus fascia was then bluntly and with sharp dissection, dissected off the rectus muscle in a superior and inferior fashion. In the process of dissecting superiorly, the parietal peritoneum was entered. Using this opening, the parietal peritoneum was further opened and the rectus fascia was then sharply dissected off the rectus muscle superiorly. It was then  noted that the omentum was adherent to the anterior abdominal wall. Using the lateral borders of the rectus muscle, the omentum was dissected off the anterior abdominal wall bilaterally and small bleeders cauterized and/or free tied. This then allowed for better visibility of the pelvis. A self-retaining retractor was then placed, the bowels were  packed upwardly, and inspection of the pelvis was notable for the uterus being small with bowel adhesions at the posterior mid wall, some periovarian adhesions of the right tube and ovary, the left ovary which was otherwise small was adherent to the bowels on the left as well as the left pelvic sidewall. The bowel was also adherent to the anterior left pelvic sidewall. The ovary was also, on the left, partially attached to the round ligament. Lysis of adhesions of the bowel adhesions on the posterior wall was done. The left round ligament was suture ligated and severed with just cautery. The left retroperitoneal area was opened. The adhesion of the bowels to the left anterior pelvic sidewall was lysed, and the left retroperitoneal space was dissected bluntly to identify the left ureter. This was carried up superiorly in order to further facilitate the exposure of the ureter. Once the ureter was identified and noted to be peristalsing, attempt was then made to lyse the adhesions of the ovary to the bowel, which was gently done using both peanuts and Metzenbaum scissors. In order to not cause any damage to the bowel, the ovary was actually taken off the bowel with a piece of the ovary remaining on the bowel about the size of a nickel. This allowed for additional adhesions inferiorly to be lysed, and the left ovarian vessels to be subsequently clamped, cut, and free tied proximally with O Vicryl x 2. The ovary was then further freed, continually taking care to identify the ureter at all times, and the left ovary was subsequently removed. Small  bleeders cauterized. The ovarian tissue on the bowel was also removed with sharp dissection and bleeder that was noted at that area was gently clamped and suture ligated with 3-0 silk suture.  Attention was then turned to the uterus where the vesicouterine peritoneum was opened. The right round ligament was suture ligated and severed using cautery. The bladder was then sharply dissected off the lower uterine segment and bleeding was encountered on the right pillar. This was subsequently hemostased with cautery. The posterior leaf of the broad ligament on the right was then opened and the utero-ovarian ligament on the right was doubly clamped, cut, suture ligated with O Vicryl and free tied with O Vicryl. The right uterine vessels were skeletonized, doubly clamped, cut, and suture ligated with O Vicryl x 2. Cardinal ligaments on the right were clamped, cut, and suture ligated with O Vicryl. On the left side, the left uterine vessels were skeletonized, doubly clamped, cut, and suture ligated with O Vicryl suture x 2. Cardinal ligaments were then serially clamped, cut, and suture ligated with O Vicryl until the cervicovaginal junction was reached. The uterosacral ligaments were clamped, cut, and suture ligated with O Vicryl. The angles of the cervicovaginal junction were then bilaterally clamped, cut, and suture ligated with O Vicryl and the cervix from its vaginal attachment. The vaginal cuff was then reefed using O Vicryl running locked stitch, and then closed in the midline with interrupted O Vicryl sutures. The abdomen was then irrigated, small bleeders cauterized with bleeding under the left external iliacs and this area was carefully inspected. Small bleeding was noted and therefore, Gelfoam was placed. Good hemostasis subsequently noted. The area in the bowel was reinspected where the ovarian remnant of tissue had been removed. Small bleeding was noted and this was again reclamped and  suture ligated with 3-0 silk. Good hemostasis was the noted. Ureter was still noted to be peristalsing on the left with  good hemostasis still noted. The appendix was identified and  noted to be normal. Upper abdomen was explored and noted to be otherwise normal. Decision was then made to close.  The self-retaining retractors were removed. All packings were removed. The vesicouterine peritoneum was not closed. The parietal peritoneum was not closed. The undersurface of the rectus fascia was inspected and small bleeders cauterized. The rectus muscle was cauterized for small bleeders. The rectus fascia was then closed with O Vicryl x 2. The subcuticular area was irrigated, small bleeders cauterized, and the skin approximated using Ethicon staples.  SPECIMENS:                    Uterus, left ovary.  ESTIMATED BLOOD LOSS:         200 cc.  INTRAOPERATIVE FLUIDS:        2450 cc.  URINE OUTPUT:                 125 cc of clear yellow urine.  COUNTS:                       Sponge and instrument counts x 2 are correct.  COMPLICATIONS:                None.  DISPOSITION:                  The patient tolerated the procedure well and was transferred to recovery room in stable condition. Dictated by:   Sheria Lang. Cherly Hensen, M.D. Attending Physician:  Maxie Better DD:  05/12/01 TD:  05/12/01 Job: [id number]

## 2011-01-10 NOTE — Op Note (Signed)
[facility]  Patient:    Shannon [name], Shannon [name]                         MRN: [id number] Proc. Date: 08/20/00 Adm. Date:  [id number] Attending:  Maxie Better                           Operative Report  PREOPERATIVE DIAGNOSES:       1. Pelvic pain.                               2. Dyspareunia.  POSTOPERATIVE DIAGNOSES:      1. Pelvic endometriosis.                               2. Abdominopelvic adhesions.                               3. Left ovarian mass.                               4. Dyspareunia.                               5. Pelvic pain.  PROCEDURE:                    Diagnostic laparoscopy.  SURGEON:                      Sheronette A. Cherly Hensen, M.D.  ASSISTANT:                    Sung Amabile. Roslyn Smiling, M.D.  ANESTHESIA:                   General.  ESTIMATED BLOOD LOSS:         Minimal.  INDICATIONS:                  This is a 40 year old gravida 3, para 2-0-1-2 female with a history of tubal ligation who is now being admitted for evaluation of chronic pelvic pain for the past three years, as well as two-year history of dyspareunia. The patient had undergone a tubal ligation about four years previously. She complained of crampy/constant pain, worse one day premenses and during her menstrual period; medications have not helped. Hydrocodone has caused some relief but has not taken the pain away. Her pain is predominantly on the left lower quadrant; however, over the past six months the pain is also present on the right side. Office ultrasound had revealed a normal sized uterus; a 0.8 x 0.7 cm mass adjacent to the left ovary. Both ovaries were otherwise normal. The patient now presents for further evaluation. Her history is notable for both her mother and her grandmother having had endometriosis which resulted in hysterectomy. Risk and benefit of the procedure has been explained to the patient including the possible need for use of laser ablation  if endometriosis is present and Dr. Roslyn Smiling was asked to assist with that particular aspect of the surgery if indicated.  DESCRIPTION OF PROCEDURE:  Under adequate general anesthesia, the patient was placed in the dorsal lithotomy position. Examination under anesthesia revealed a retroflexed uterus of a normal size, right adnexa without any palpable mass, left adnexa with a fixed, about 2 cm or so mass at about the level of the lower uterine segment and no nodularity appreciated on the posterior cul-de-sac. The patient was then sterilely prepped and draped in the usual fashion for a laparoscopy. The bladder was catheterized for a small amount of urine. Bivalve speculum was placed in the vagina. A single-tooth tenaculum was placed on the anterior lip of the cervix. A Kohn cannula was introduced into the cervical os and attached to the tenaculum for manipulation of the uterus and the bivalve speculum was subsequently removed. Attention was then turned to the abdomen where a small, infraumbilical vertical incision was made. Veress needle was introduced as the placement was tested with normal saline with good placement noted. Opening pressure of 5 was noted. Three and a half liters of CO2 was insufflated. The Veress needle was removed. A 10-mm trocar with sleeve was introduced into the abdomen without incident. A lighted video laparoscope was introduced through that port confirming entry into the abdomen without incident. The patient was placed in deep Trendelenburg and a small suprapubic incision was then made and a 5 mm port was introduced under direct visualization. Panoramic inspection was done of the abdomen and pelvis. Normal liver edge was noted. Normal gallbladder was noted. Normal appendix with large amount of periappendiceal fat noted. There was an omental adhesion in the right anterior upper abdominal wall. The left upper abdominal wall also contained similar adhesion.  Attention was then turned to the pelvis where using a probe through the second port, the pelvis was inspected. The anterior cul-de-sac was notable with some areas of scarring suggestive of old endometriosis. The uterus was normal in appearance; No irregularities to suggest uterine fibroids. The right ovary was normal. The under surface of the right ovary was inspected; no endometriosis was noted. The right tube had a paratubal cyst and evidence of surgical separation from the tubal ligation. The left tube also had a distal paratubal cyst and evidence of surgical separation in its midportion. The posterior cul-de-sac was inspected and was notable for adhesions and areas suggestive of old endometriosis as well as an endometriotic implant, of focus of it of approximately 1.5 cm under the left ovary. The uterosacral ligaments were distorted with the ligaments being pulled, both the right and particularly the left as well, underneath the left ovary. The left ovary appeared normal but was firmly attached to the left posterior leaf of the broad ligament and the left ovarian fossa area. The probe was then replaced by an atraumatic grasper and the left fallopian tube was grasped and the undersurface of the left ovary was further attempted to be evaluated. It was then noted that there was a mass that appears to be possibly attached and/or a part of the left ovary that was also attached onto the undersurface of that left ovary. It could not be displaced with blunt dissection. With further inspection and identification of the root of the ureter on the left, it appears to be the attachment would be involving that particular area as well.  The decision was made to forego any attempt at adhesiolysis due to the concern for this left adjacent ovarian mass, the location of the ureter on the left side and to adequately consent the patient for laparotomy which had not been done, as  this was not anticipated  with respect to the consented procedure. the pictures were taken of the areas of abnormality. The procedure was terminated by removing the suprapubic port under direct visualization, deflating the  abdomen, and removing the infraumbilical port under direct visualization. The infraumbilical site was closed with a fascial stitch of 0 Vicryl. The skin was injected with 0.25% Marcaine and the skin of both sides was approximated with Dermabond. The instruments in the vagina were removed.  SPECIMENS:                    None.  COMPLICATIONS:                None.  DISPOSITION:                  The patient tolerated the procedure well and was transferred to the recovery room in stable condition. DD:  08/20/00 TD:  08/20/00 Job: [id number]

## 2011-01-10 NOTE — Op Note (Signed)
[facility]  Patient:    Shannon [name], Shannon [name]               MRN: [id number] Proc. Date: 10/12/00 Adm. Date:  [id number] Attending:  Maxie Better                           Operative Report  PREOPERATIVE DIAGNOSIS: 1. Pelvic endometriosis. 2. Left ovarian mass. 3. Pelvic pain.  POSTOPERATIVE DIAGNOSIS: 1. Left ovarian endometrioma. 2. Pelvic endometriosis with left uterosacral ligament involvement.  OPERATION: 1. Examination under anesthesia. 2. Excision of uterosacral endometriotic implant. 3. Suspension of left ovary. 4. Distal left salpingectomy. 5. Left ovarian endometriotic cystectomy.  SURGEON:  Sheronette A. Cherly Hensen, M.D.  ASSISTANT:  Silverio Lay, M.D.  ANESTHESIA:  General.  DESCRIPTION OF PROCEDURE:  Under adequate general anesthesia, the patient was placed in the supine position.  Examination under anesthesia revealed a retroverted uterus.  Right adnexa uncompromised.  Left adnexa had a fixed palpable ______ .  The patient was thoroughly prepped and draped in the usual fashion.  The bladder had indwelling Foley catheter placed sterilely.  A Pfannenstiel skin incision was then made, carried down to the rectus fascia. The rectus fascia was incised in the midline and extended bilaterally.  The rectus fascia was then dissected bluntly and with cautery off the rectus muscle in superior and inferior fashion.  The rectus muscle was split in the midline.  The parietal peritoneum was entered bluntly and extended superiorly and inferiorly.  The bowels were packed upwardly.  A self-retaining retractor was then placed.  Inspection of the pelvis revealed no evidence of the previous left ovarian mass which had been adjacent to the left ovary; however, the left ovary which appeared to be normal in size was adherent to the posterior fossa as well as to the left uterosacral ligament with the tenting of the rectosigmoid to this complex.   There were small endometriotic implants on the serosa overlying the left ureter.  Anterior cul-de-sac was without any endometriosis.  The right ovary was normal without any endometriosis, and the remaining posterior cul-de-sac was without any other palpable areas of endometriosis.  The left ovary was inspected carefully.  The left ureter was identified and was noted to be curved, soft, and of and of normal caliber and did not appear to be involved with this endometriosis.  The left ovary had a shortened utero-ovarian ligament.  The left ovary was, with blunt dissection, raised from it attachment to the posterior fossa at which time dark chocolate material was then noted to be extruding from the ovary.  The decision was then made to open the left retroperitoneal space and dissect down further to delineate the left ureter.  This did not help to facilitate the dissection that was necessary for the uterosacral ligament endometriotic implant. Therefore, this was not further carried out.  With saline injections around the endometriotic implant superficially, the endometrial implants were then sharply dissected which subsequently released the left uterosacral ligament as well as the tenting of the rectosigmoid.  The endometriotic implant overlying the left ureter was also gently removed.  The left ovary was then opened further, and the endometriotic cyst wall was removed.  The ovary was then closed with 3-0 Vicryl sutures.  Hemostasis was then accomplished with cautery.  Due to the retroflexed position of the uterus, concern was raised regarding the ovary becoming once again fixed into the left ovarian fossa, and decision was then made  to perform a suspension.  Initially the space underneath the left round ligament was opened, and attempt was made to place the left tube and ovaries through that defect; however, due to the shortened left utero-ovarian ligament, this was not felt to be a worthy  prospect. Therefore, the left utero-ovarian ligament was clamped, cut, and suture ligated.  The portion of the uterus was also suture ligated with 0 Vicryl sutures with good hemostasis noted.  The left ovarian ligaments were then attached to the round ligament.  The distal fallopian tube, which had previously undergone tubal ligation, was noted to have areas of bleeding and was subsequently removed.  The abdomen was copiously irrigated.  Bleeders were cauterized.  Interceed was placed in the raw surface posteriorly to the uterus.  The self retaining retractors were removed as was all packing.  The parietal peritoneum was then closed.  The rectus fascia was inspected.  The rectus muscle was also inspected, small bleeders cauterized.  The rectus fascia was closed with 0 Vicryl x 2.  The skin was injected with 0.25% Marcaine and approximated with Ethicon staples.  Specimen was left endometriotic ovarian cyst, endometriotic implants, distal portion of the left fallopian tube.  Complications were none.  Sponge and instrument counts x 2 were correct.  Urine output was 200 cc of clear yellow urine.  Estimated blood loss was about 150 cc.  Intraoperative fluid was 1700 cc crystalloid.  The patient tolerated the procedure well and was transferred to the recovery room in stable condition. DD:  10/12/00 TD:  10/13/00 Job: [id number]

## 2011-01-10 NOTE — Discharge Summary (Signed)
[facility]  Patient:    Shannon [name], Shannon [name] Visit Number: [id number] MRN: [id number]          Service Type: GYN Location: [phone] Attending Physician:  Maxie Better Admit Date:  05/12/2001 Discharge Date: 05/14/2001                             Discharge Summary  ADMISSION DIAGNOSES:          1. Chronic left lower quadrant pain.                               2. Pelvic endometriosis.  DISCHARGE DIAGNOSES:          1. Chronic left lower quadrant pain.                               2. Pelvic endometriosis.                               3. Abdominopelvic adhesions.  PROCEDURE:                    Exploratory laparotomy, total abdominal hysterectomy, left salpingo-oophorectomy, lysis of adhesions.  HISTORY OF PRESENT ILLNESS:   This is a 40 year old gravida 3, para 2-0-1-2 female with a history of tubal ligation and known pelvic endometriosis who has been admitted for TAH LSO secondary to persistent left lower quadrant pain with associated dyspareunia.  Please see the dictated history and physical for the specific details.  HOSPITAL COURSE:              The patient was admitted to [facility]. She was taken to the operating room where she underwent exploratory laparotomy, total abdominal hysterectomy, left salpingo-oophorectomy, and lysis of adhesions.  Please see the operative report for the specific findings at the time of surgery.  The patient had an uncomplicated postoperative course.  Her CBC on postoperative day #1, showed a hemoglobin of 10.4, hematocrit of 30.3, platelet count of 404,000.  On postoperative day #2, the patient subsequently passed flatus and desire to go home.  She had remained afebrile throughout her hospital stay.  She was tolerating a regular diet. Her incision showed no evidence of infection.  She had staples in place.  No erythema, induration, or exudates noted.  She had no need to use a pad.  The pathology  was benign.  DISPOSITION:                  Home.  CONDITION ON DISCHARGE:       Stable.  DISCHARGE MEDICATIONS:        1. Tylox, #25, 1-2 tablets q.3-4h. p.r.n. pain.                               2. Motrin 800 mg 1 p.o. q.6-8h. p.r.n. pain.  FOLLOW-UP:                    Follow-up appointment and staple removal next week in the office and otherwise postoperative visit at four weeks post surgery.  DISCHARGE INSTRUCTIONS:       Call for temperature greater than or equal to 100.4.  Call if increased incisional pain,  redness, or drainage from the incision site.  Nothing per vagina for 4-6 weeks.  No heavy lifting or driving for two weeks.  Call if severe abdominal pain, nausea, or vomiting.  Call if soaking a regular pad every hour or more frequently. Attending Physician:  Maxie Better DD:  06/08/01 TD:  06/08/01 Job: [id number]

## 2012-05-27 ENCOUNTER — Encounter: Payer: Self-pay | Admitting: Vascular Surgery

## 2014-03-13 ENCOUNTER — Encounter (HOSPITAL_COMMUNITY)
Admission: EM | Disposition: A | Discharge status: Home / Self Care | Payer: BC Managed Care – PPO | Source: Ambulatory Visit | Attending: Cardiovascular Disease

## 2014-03-13 ENCOUNTER — Inpatient Hospital Stay (HOSPITAL_COMMUNITY)
Admission: EM | Admit: 2014-03-13 | Discharge: 2014-03-15 | DRG: 247 | Disposition: A | Discharge status: Home / Self Care | Payer: BC Managed Care – PPO | Source: Ambulatory Visit | Attending: Cardiovascular Disease | Admitting: Cardiovascular Disease

## 2014-03-13 ENCOUNTER — Encounter (HOSPITAL_COMMUNITY): Payer: Self-pay | Admitting: Cardiovascular Disease

## 2014-03-13 DIAGNOSIS — I2102 ST elevation (STEMI) myocardial infarction involving left anterior descending coronary artery: Secondary | ICD-10-CM | POA: Diagnosis present

## 2014-03-13 DIAGNOSIS — I739 Peripheral vascular disease, unspecified: Secondary | ICD-10-CM | POA: Diagnosis present

## 2014-03-13 DIAGNOSIS — I2510 Atherosclerotic heart disease of native coronary artery without angina pectoris: Secondary | ICD-10-CM

## 2014-03-13 DIAGNOSIS — F17200 Nicotine dependence, unspecified, uncomplicated: Secondary | ICD-10-CM | POA: Diagnosis present

## 2014-03-13 DIAGNOSIS — I2582 Chronic total occlusion of coronary artery: Secondary | ICD-10-CM | POA: Diagnosis present

## 2014-03-13 DIAGNOSIS — I749 Embolism and thrombosis of unspecified artery: Secondary | ICD-10-CM | POA: Diagnosis not present

## 2014-03-13 DIAGNOSIS — Z955 Presence of coronary angioplasty implant and graft: Secondary | ICD-10-CM

## 2014-03-13 DIAGNOSIS — I2109 ST elevation (STEMI) myocardial infarction involving other coronary artery of anterior wall: Principal | ICD-10-CM | POA: Diagnosis present

## 2014-03-13 DIAGNOSIS — I4729 Other ventricular tachycardia: Secondary | ICD-10-CM | POA: Diagnosis not present

## 2014-03-13 DIAGNOSIS — I472 Ventricular tachycardia, unspecified: Secondary | ICD-10-CM | POA: Diagnosis not present

## 2014-03-13 DIAGNOSIS — Z720 Tobacco use: Secondary | ICD-10-CM

## 2014-03-13 DIAGNOSIS — I771 Stricture of artery: Secondary | ICD-10-CM | POA: Diagnosis present

## 2014-03-13 HISTORY — PX: LEFT HEART CATHETERIZATION WITH CORONARY ANGIOGRAM: SHX5451

## 2014-03-13 LAB — MRSA PCR SCREENING: MRSA BY PCR: NEGATIVE

## 2014-03-13 LAB — COMPREHENSIVE METABOLIC PANEL
ALT: 18 U/L (ref 0–35)
AST: 119 U/L — ABNORMAL HIGH (ref 0–37)
Albumin: 3.6 g/dL (ref 3.5–5.2)
Alkaline Phosphatase: 95 U/L (ref 39–117)
Anion gap: 16 — ABNORMAL HIGH (ref 5–15)
BUN: 11 mg/dL (ref 6–23)
CO2: 21 mEq/L (ref 19–32)
Calcium: 8.3 mg/dL — ABNORMAL LOW (ref 8.4–10.5)
Chloride: 100 mEq/L (ref 96–112)
Creatinine, Ser: 0.38 mg/dL — ABNORMAL LOW (ref 0.50–1.10)
GFR calc Af Amer: >90 mL/min (ref >90)
GFR calc non Af Amer: >90 mL/min (ref >90)
Glucose, Bld: 141 mg/dL — ABNORMAL HIGH (ref 70–99)
Potassium: 3.7 mEq/L (ref 3.7–5.3)
Sodium: 137 mEq/L (ref 137–147)
Total Bilirubin: <0.2 mg/dL — ABNORMAL LOW (ref 0.3–1.2)
Total Protein: 6.6 g/dL (ref 6.0–8.3)

## 2014-03-13 LAB — CBC
HEMATOCRIT: 37.9 % (ref 36.0–46.0)
HEMOGLOBIN: 12.4 g/dL (ref 12.0–15.0)
MCH: 29.2 pg (ref 26.0–34.0)
MCHC: 32.7 g/dL (ref 30.0–36.0)
MCV: 89.2 fL (ref 78.0–100.0)
Platelets: 358 10*3/uL (ref 150–400)
RBC: 4.25 MIL/uL (ref 3.87–5.11)
RDW: 14.1 % (ref 11.5–15.5)
WBC: 19.4 10*3/uL — AB (ref 4.0–10.5)

## 2014-03-13 LAB — POCT I-STAT, CHEM 8
BUN: 10 mg/dL (ref 6–23)
CHLORIDE: 101 meq/L (ref 96–112)
Calcium, Ion: 1.21 mmol/L (ref 1.12–1.23)
Creatinine, Ser: 0.4 mg/dL — ABNORMAL LOW (ref 0.50–1.10)
Glucose, Bld: 149 mg/dL — ABNORMAL HIGH (ref 70–99)
HCT: 40 % (ref 36.0–46.0)
Hemoglobin: 13.6 g/dL (ref 12.0–15.0)
Potassium: 3.6 mEq/L — ABNORMAL LOW (ref 3.7–5.3)
SODIUM: 139 meq/L (ref 137–147)
TCO2: 22 mmol/L (ref 0–100)

## 2014-03-13 LAB — TROPONIN I
Troponin I: >20 ng/mL (ref <0.30)
Troponin I: >20 ng/mL (ref <0.30)
Troponin I: >20 ng/mL (ref <0.30)

## 2014-03-13 LAB — LIPID PANEL
CHOL/HDL RATIO: 5.3 ratio
CHOLESTEROL: 192 mg/dL (ref 0–200)
HDL: 36 mg/dL — ABNORMAL LOW (ref >39)
LDL Cholesterol: 123 mg/dL — ABNORMAL HIGH (ref 0–99)
Triglycerides: 163 mg/dL — ABNORMAL HIGH (ref <150)
VLDL: 33 mg/dL (ref 0–40)

## 2014-03-13 LAB — POCT ACTIVATED CLOTTING TIME
ACTIVATED CLOTTING TIME: 478 s
Activated Clotting Time: 490 seconds

## 2014-03-13 LAB — APTT: APTT: 113 s — AB (ref 24–37)

## 2014-03-13 LAB — HEMOGLOBIN A1C
Hgb A1c MFr Bld: 5.7 % — ABNORMAL HIGH (ref <5.7)
Mean Plasma Glucose: 117 mg/dL — ABNORMAL HIGH (ref <117)

## 2014-03-13 LAB — CK TOTAL AND CKMB (NOT AT ARMC)
CK, MB: 167.2 ng/mL (ref 0.3–4.0)
RELATIVE INDEX: 11.9 — AB (ref 0.0–2.5)
Total CK: 1400 U/L — ABNORMAL HIGH (ref 7–177)

## 2014-03-13 LAB — PROTIME-INR
INR: 3.82 — AB (ref 0.00–1.49)
PROTHROMBIN TIME: 37.6 s — AB (ref 11.6–15.2)

## 2014-03-13 SURGERY — LEFT HEART CATHETERIZATION WITH CORONARY ANGIOGRAM
Anesthesia: LOCAL

## 2014-03-13 MED ORDER — MIDAZOLAM HCL 2 MG/2ML IJ SOLN
INTRAMUSCULAR | Status: AC
  Filled 2014-03-13: qty 2

## 2014-03-13 MED ORDER — NITROGLYCERIN 1 MG/10 ML FOR IR/CATH LAB
INTRA_ARTERIAL | Status: AC
  Filled 2014-03-13: qty 10

## 2014-03-13 MED ORDER — PRASUGREL HCL 10 MG PO TABS
ORAL_TABLET | ORAL | Status: AC
  Filled 2014-03-13: qty 6

## 2014-03-13 MED ORDER — FENTANYL CITRATE 0.05 MG/ML IJ SOLN
INTRAMUSCULAR | Status: AC
  Filled 2014-03-13: qty 2

## 2014-03-13 MED ORDER — PRASUGREL HCL 10 MG PO TABS
10.0000 mg | ORAL_TABLET | Freq: Every day | ORAL | Status: DC
  Administered 2014-03-14 – 2014-03-15 (×2): 10 mg via ORAL
  Filled 2014-03-13 (×2): qty 1

## 2014-03-13 MED ORDER — TIROFIBAN HCL IV 5 MG/100ML
0.1500 ug/kg/min | INTRAVENOUS | Status: AC
  Administered 2014-03-13 – 2014-03-14 (×2): 0.15 ug/kg/min via INTRAVENOUS
  Filled 2014-03-13 (×4): qty 100

## 2014-03-13 MED ORDER — SODIUM CHLORIDE 0.9 % IV SOLN: INTRAVENOUS | Status: AC

## 2014-03-13 MED ORDER — MORPHINE SULFATE 2 MG/ML IJ SOLN: 2.0000 mg | INTRAMUSCULAR | Status: DC | PRN

## 2014-03-13 MED ORDER — OXYCODONE-ACETAMINOPHEN 5-325 MG PO TABS
1.0000 | ORAL_TABLET | ORAL | Status: DC | PRN
  Administered 2014-03-13: 1 via ORAL
  Administered 2014-03-14: 2 via ORAL
  Filled 2014-03-13: qty 1
  Filled 2014-03-13: qty 2

## 2014-03-13 MED ORDER — BIVALIRUDIN 250 MG IV SOLR
INTRAVENOUS | Status: AC
Start: 2014-03-13 — End: 2014-03-13
  Filled 2014-03-13: qty 250

## 2014-03-13 MED ORDER — METOPROLOL TARTRATE 25 MG PO TABS
25.0000 mg | ORAL_TABLET | Freq: Two times a day (BID) | ORAL | Status: DC
Start: 2014-03-13 — End: 2014-03-15
  Administered 2014-03-13 – 2014-03-14 (×3): 25 mg via ORAL
  Filled 2014-03-13 (×8): qty 1

## 2014-03-13 MED ORDER — ONDANSETRON HCL 4 MG/2ML IJ SOLN
4.0000 mg | Freq: Four times a day (QID) | INTRAMUSCULAR | Status: DC | PRN
  Administered 2014-03-13 – 2014-03-14 (×2): 4 mg via INTRAVENOUS
  Filled 2014-03-13 (×3): qty 2

## 2014-03-13 MED ORDER — ATORVASTATIN CALCIUM 80 MG PO TABS
80.0000 mg | ORAL_TABLET | Freq: Every day | ORAL | Status: DC
  Administered 2014-03-13 – 2014-03-14 (×2): 80 mg via ORAL
  Filled 2014-03-13 (×4): qty 1

## 2014-03-13 MED ORDER — LIDOCAINE HCL (PF) 1 % IJ SOLN
INTRAMUSCULAR | Status: AC
  Filled 2014-03-13: qty 30

## 2014-03-13 MED ORDER — ACETAMINOPHEN 325 MG PO TABS
650.0000 mg | ORAL_TABLET | ORAL | Status: DC | PRN
  Administered 2014-03-13 – 2014-03-14 (×2): 650 mg via ORAL
  Filled 2014-03-13 (×2): qty 2

## 2014-03-13 MED ORDER — HEPARIN (PORCINE) IN NACL 2-0.9 UNIT/ML-% IJ SOLN
INTRAMUSCULAR | Status: AC
  Filled 2014-03-13: qty 1000

## 2014-03-13 MED ORDER — BIVALIRUDIN 250 MG IV SOLR
INTRAVENOUS | Status: AC
  Filled 2014-03-13: qty 250

## 2014-03-13 MED ORDER — ASPIRIN 81 MG PO CHEW
81.0000 mg | CHEWABLE_TABLET | Freq: Every day | ORAL | Status: DC
  Administered 2014-03-14 – 2014-03-15 (×2): 81 mg via ORAL
  Filled 2014-03-13 (×2): qty 1

## 2014-03-13 MED ORDER — VERAPAMIL HCL 2.5 MG/ML IV SOLN
INTRAVENOUS | Status: AC
  Filled 2014-03-13: qty 2

## 2014-03-13 NOTE — Progress Notes (Signed)
MEDICATION RELATED CONSULT NOTE - INITIAL   Pharmacy Consult for Aggrastat Indication: MI  No Known Allergies  Patient Measurements: Height: 5\' 4"  (162.6 cm) Weight: 138 lb 14.2 oz (63 kg) IBW/kg (Calculated) : 54.7 Adjusted Body Weight:   Vital Signs: BP: 119/68 mmHg (07/20 1600) Pulse Rate: 63 (07/20 1031) Intake/Output from previous day:   Intake/Output from this shift: Total I/O In: 75 [I.V.:75] Out: -   Labs:  Recent Labs  03/13/14 1310  WBC 19.4*  HGB 12.4  HCT 37.9  PLT 358  APTT 113*  CREATININE 0.38*  ALBUMIN 3.6  PROT 6.6  AST 119*  ALT 18  ALKPHOS 95  BILITOT <0.2*   Estimated Creatinine Clearance: 78.3 ml/min (by C-G formula based on Cr of 0.38).   Microbiology: No results found for this or any previous visit (from the past 720 hour(s)).  Medical History: Past Medical History  Diagnosis Date  . Tobacco abuse   . PAD (peripheral artery disease)     Medications:  No prescriptions prior to admission   Scheduled:  . [START ON 03/14/2014] aspirin  81 mg Oral Daily  . atorvastatin  80 mg Oral q1800  . metoprolol tartrate  25 mg Oral BID  . [START ON 03/14/2014] prasugrel  10 mg Oral Daily    Assessment: 43 yo who is s/p STEMI. She went to the cath lab and got PTCA. Aggrastat was started and will be cont for 18 hrs post procedure.    Plan:   Cont Aggrastat at 0.3515mcg/kg/min x 18 hrs F/u with plt

## 2014-03-13 NOTE — Progress Notes (Signed)
TR band fully deflated. Pt stable, ambulated to restroom with minimal assistance. Reinforcement needed concerning patient using her Right hand after cath.  Will continue to monitor closely.

## 2014-03-13 NOTE — Progress Notes (Signed)
Critical values CK,MB 167.2 Troponin >20.00.  Called to Dr. Clifton JamesMcalhany .

## 2014-03-13 NOTE — Progress Notes (Signed)
Went to the cath lab for Code STEMI earlier today. Confirmed patient was given 4 baby aspirin by EMS today.  [place] NorthJennifer [place], 1700 Rainbow BoulevardPharm.D., BCPS Clinical Pharmacist Pager: [phone] 03/13/2014 3:26 PM

## 2014-03-13 NOTE — CV Procedure (Signed)
     Cardiac Catheterization Operative Report  Judieth Keensara C Bruinsma [id number] 7/20/201511:57 AM No primary provider on file.  Procedure Performed:  1. Left Heart Catheterization 2. Selective Coronary Angiography 3. Left ventricular angiogram 4. PTCA/DES x 2 mid LAD 5. Angioseal RFA  Operator: Verne Carrowhristopher McAlhany, MD  Indication:   43 yo female with history of tobacco abuse presenting with anterior STEMI.                               Procedure Details: The risks, benefits, complications, treatment options, and expected outcomes were discussed with the patient. Emergency consent obtained. The patient was brought to the cath lab via EMS. The patient was further sedated with Versed and Fentanyl. The right groin was prepped and draped in the usual manner. Using the modified Seldinger access technique, a 6 French sheath was placed in the right femoral artery. Standard diagnostic catheters were used to perform selective coronary angiography. She was found to have total occlusion of the mid LAD. I elected to perform PCI of the LAD.   PCI Note: The left main was engaged with a XB LAD 3.5 guiding catheter. She was given Effient 60 mg po x 1. She was given a weight based bolus of Angiomax and a drip was started. When the ACT was over 200, I passed a Cougar IC wire into the mid LAD. I could not pass the stenosis. I then used a 2.0 x 12 mm OTW balloon and a long Whisper wire to cross the lesion. The lesion was pre-dilated x 5. A 2.25 x 22 mm Resolute DES was deployed in the mid LAD. A 2.5 x 26 mm Resolute DES was deployed in the mid LAD overlapping on the proximal segment of the first stent. The distal stented segment was post-dilated with a 2.5 x 20 mm [place] balloon x 2. The proximal stented segment was post-dilated with a 2.75 x 20 mm [place] balloon x 2. The stent did not seem to be well expanded proximally so I used a 2.75 x 12 mm [place] balloon x 2 post-dilate the proximal stented segment. There was an excellent  angiographic result with TIMI-3 flow down the LAD.  A pigtail catheter was used to perform a left ventricular angiogram.  There were no immediate complications. The patient was taken to the recovery area in stable condition.   Hemodynamic Findings: Central aortic pressure: 134/78 Left ventricular pressure: 132/9/17  Angiographic Findings:  Left main: 30% distal stenosis.   Left Anterior Descending Artery: Large caliber vessel that courses to the apex. The proximal vessel has diffuse 30% stenosis. There is a moderate caliber diagonal branch with ostial 50% stenosis. The mid LAD is totally occluded after the diagonal branch.   Circumflex Artery: Small to moderate caliber vessel with small Obtuse marginal branch. 40% mid stenosis.   Right Coronary Artery: Large dominant vessel with 50% proximal stenosis. 30% mid stenosis.  Left Ventricular Angiogram: LVEF=40% with hypokinesis of the anterior wall and apex.   Impression: 1. Acute anterior MI secondary to occluded mid LAD 2. Successful PTCA/DES x 2 mid LAD 3. Moderate disease RCA 4. Moderate segmental LV systolic dysfunction  Recommendations: To CCU. Echo in am. Start beta blocker and statin. Continue ASA, Effient.        Complications:  None. The patient tolerated the procedure well.

## 2014-03-13 NOTE — H&P (Signed)
     Patient ID: Shannon [name] MRN: [id number] DOB/AGE: [birth year] 43 y.o. Admit date: 03/13/2014  Primary Care Physician: Primary Cardiologist:   HPI: 43 yo female with history of tobacco abuse, subclavian artery stenosis s/p carotid/subclavian bypass here with ongoing chest pain. EKG with ST elevation anterior leads.   Review of systems complete and found to be negative unless listed above   Past Medical History  Diagnosis Date  . Tobacco abuse   . PAD (peripheral artery disease)     Family History  Problem Relation Age of Onset  . CAD Father     History   Social History  . Marital Status: Married    Spouse Name: N/A    Number of Children: N/A  . Years of Education: N/A   Occupational History  . Not on file.   Social History Main Topics  . Smoking status: Current Every Day Smoker -- 1.00 packs/day for 25 years    Types: Cigarettes  . Smokeless tobacco: Not on file  . Alcohol Use: No  . Drug Use: No  . Sexual Activity: Not on file   Other Topics Concern  . Not on file   Social History Narrative  . No narrative on file    Past Surgical History  Procedure Laterality Date  . Cholecystectomy    . Carotid artery - subclavian artery bypass graft      No Known Allergies  Prior to Admission Meds:  No prescriptions prior to admission  None  Physical Exam: There were no vitals taken for this visit.    General: Well developed, well nourished, appears uncomfortable  HEENT: OP clear, mucus membranes moist  SKIN: warm, dry. No rashes.  Neuro: No focal deficits  Musculoskeletal: Muscle strength 5/5 all ext  Psychiatric: Mood and affect normal  Neck: No JVD, no carotid bruits, no thyromegaly, no lymphadenopathy.  Lungs:Clear bilaterally, no wheezes, rhonci, crackles  Cardiovascular: Regular rate and rhythm. No murmurs, gallops or rubs.  Abdomen:Soft. Bowel sounds present. Non-tender.  Extremities: No lower extremity edema. Pulses are 2 + in the bilateral  DP/PT.   Labs: Pending   EKG: Atrial fib, ST elevation V3, V4  ASSESSMENT AND PLAN:   1. Anterior STEMI: Emergent cath. Further plans to follow  2. Tobacco abuse: Cessation recommended  Earney Hamburghris McAlhany, MD 03/13/2014, 10:35 AM

## 2014-03-13 NOTE — CV Procedure (Signed)
     Cardiac Catheterization Operative Report  Judieth Keensara C [place] [id number] 7/20/20151:10 PM No primary provider on file.  Procedure Performed:  1. Selective coronary angiography 2. IVUS of the LAD 3. Aspiration thrombectomy mid LAD 4. PTCA of the mid LAD (balloon only with non-compliant balloon)    Operator: Verne Carrowhristopher Deandre Stansel, MD  Access: right radial artery  Indication: 43 yo female admitted this morning with acute anterior STEMI. LAD was occluded. DES x 2 placed in the mid LAD. Pt was being moved out of the cath lab and had onset of 10/10 chest pain. EKG with recurrent ST elevation anterior and inferior leads.                                   Procedure Details: Emergency consent obtained. Right wrist prepped and draped. 1% lidocaine used for local anesthesia. 5/6 french placed right radial artery. The patient was further sedated with Versed and Fentanyl. Selective angiography of the left coronary system with a XB  LAD 3.5 guiding catheter. The LAD was found to be occluded.   PCI Note: She was given a bolus of Angiomax and a drip was started. She was given a bolus of Aggrastat and a drip was started. I then passed a Cougar IC wire down the LAD. A 2.5 x 12 mm balloon was inflated x 3 in the mid stented segment to restore flow. I then passed a Priority aspiration catheter down the LAD and thrombus was extracted. I then performed an IVUS of the stented segment. No mechanical complications were seen. The stent was well apposed to the vessel wall but appeared to be underexpanded for the vessel size. I then post-dilated the entire stented segment with a 3.25 x 20 mm [place] balloon x 3. The final angiographic result was excellent. Final IVUS run demonstrated good stent expansion with good apposition to the vessel wall. Flow down the LAD was TIMI-3.  There were no immediate complications. The patient was taken to the recovery area in stable condition. Radial sheath was removed and a Terumo hemostasis  band was applied to the radial artery.   Hemodynamic Findings: Central aortic pressure: 134/77  Angiographic findings:   Left main: patent, mild distal disease LAD: 100% mid occlusion in stent Circumflex: patent with mild diffuse plaque  Impression: 1. Acute stent thrombosis of the mid LAD stent 2. Successful PTCA of the LAD stented segment following IVUS guidance, thrombectomy LAD.   Recommendations:  She will be admitted to the CCU. Echo in am. Statin, beta blocker, ASA, Effient. Will run Aggrastat for 18 hours.        Complications:  None; patient tolerated the procedure well.

## 2014-03-14 DIAGNOSIS — I369 Nonrheumatic tricuspid valve disorder, unspecified: Secondary | ICD-10-CM

## 2014-03-14 DIAGNOSIS — F17200 Nicotine dependence, unspecified, uncomplicated: Secondary | ICD-10-CM

## 2014-03-14 LAB — CBC
HEMATOCRIT: 36.4 % (ref 36.0–46.0)
HEMOGLOBIN: 11.7 g/dL — AB (ref 12.0–15.0)
MCH: 28.9 pg (ref 26.0–34.0)
MCHC: 32.1 g/dL (ref 30.0–36.0)
MCV: 89.9 fL (ref 78.0–100.0)
Platelets: 341 10*3/uL (ref 150–400)
RBC: 4.05 MIL/uL (ref 3.87–5.11)
RDW: 14.3 % (ref 11.5–15.5)
WBC: 13.9 10*3/uL — ABNORMAL HIGH (ref 4.0–10.5)

## 2014-03-14 LAB — BASIC METABOLIC PANEL
Anion gap: 16 — ABNORMAL HIGH (ref 5–15)
BUN: 10 mg/dL (ref 6–23)
CHLORIDE: 101 meq/L (ref 96–112)
CO2: 22 meq/L (ref 19–32)
Calcium: 8.7 mg/dL (ref 8.4–10.5)
Creatinine, Ser: 0.48 mg/dL — ABNORMAL LOW (ref 0.50–1.10)
GFR calc Af Amer: >90 mL/min (ref >90)
GFR calc non Af Amer: >90 mL/min (ref >90)
Glucose, Bld: 92 mg/dL (ref 70–99)
POTASSIUM: 3.8 meq/L (ref 3.7–5.3)
Sodium: 139 mEq/L (ref 137–147)

## 2014-03-14 LAB — LIPID PANEL
CHOLESTEROL: 182 mg/dL (ref 0–200)
HDL: 33 mg/dL — AB (ref >39)
LDL Cholesterol: 113 mg/dL — ABNORMAL HIGH (ref 0–99)
Total CHOL/HDL Ratio: 5.5 RATIO
Triglycerides: 180 mg/dL — ABNORMAL HIGH (ref <150)
VLDL: 36 mg/dL (ref 0–40)

## 2014-03-14 LAB — TROPONIN I

## 2014-03-14 MED ORDER — PERFLUTREN LIPID MICROSPHERE
INTRAVENOUS | Status: AC
  Administered 2014-03-14: 1.5 mL
  Filled 2014-03-14: qty 10

## 2014-03-14 MED ORDER — POTASSIUM CHLORIDE CRYS ER 20 MEQ PO TBCR
40.0000 meq | EXTENDED_RELEASE_TABLET | Freq: Once | ORAL | Status: AC
  Administered 2014-03-14: 40 meq via ORAL
  Filled 2014-03-14: qty 2

## 2014-03-14 MED ORDER — PERFLUTREN LIPID MICROSPHERE
1.0000 mL | INTRAVENOUS | Status: AC | PRN
  Filled 2014-03-14: qty 10

## 2014-03-14 MED ORDER — SODIUM CHLORIDE 0.9 % IV SOLN
INTRAVENOUS | Status: DC
  Administered 2014-03-14: 10 mL/h via INTRAVENOUS

## 2014-03-14 MED FILL — Sodium Chloride IV Soln 0.9%: INTRAVENOUS | Qty: 50 | Status: AC

## 2014-03-14 NOTE — Care Management Note (Signed)
    Page 1 of 1   03/14/2014     11:27:10 AM CARE MANAGEMENT NOTE 03/14/2014  Patient:  Shannon [name],Shannon [name]   Account Number:  [id number]  Date Initiated:  03/13/2014  Documentation initiated by:  Junius CreamerWELL,DEBBIE  Subjective/Objective Assessment:   adm w mi     Action/Plan:   lives w husband   Anticipated DC Date:     Anticipated DC Plan:  HOME/SELF CARE      DC Planning Services  CM consult  Medication Assistance      Choice offered to / List presented to:             Status of service:   Medicare Important Message given?   (If response is "NO", the following Medicare IM given date fields will be blank) Date Medicare IM given:   Medicare IM given by:   Date Additional Medicare IM given:   Additional Medicare IM given by:    Discharge Disposition:    Per UR Regulation:  Reviewed for med. necessity/level of care/duration of stay  If discussed at Long Length of Stay Meetings, dates discussed:    Comments:  7/21 1125 debbie Joselyn Edling rn,bsn spoke w pt. gave her effeint 30day free and copay assist card. pt states has [facility] ins. left pt [facility]. she has not pcp but can call customer service w [facility] to see who's in network.

## 2014-03-14 NOTE — Progress Notes (Signed)
Echocardiogram 2D Echocardiogram has been performed.  Massey Ruhland 03/14/2014, 11:20 AM

## 2014-03-14 NOTE — Progress Notes (Signed)
    Subjective:  No chest pain currently. Her husband Shannon [name] is at bedside. No shortness of breath. She asked about smoking/vapor cigarettes.  Objective:  Vital Signs in the last 24 hours: Temp:  [98.3 F (36.8 C)-99.1 F (37.3 C)] 98.3 F (36.8 C) (07/21 0400) Pulse Rate:  [63-69] 69 (07/20 2000) Resp:  [11-27] 24 (07/21 0700) BP: (105-134)/(65-91) 108/69 mmHg (07/21 0700) SpO2:  [92 %-100 %] 99 % (07/21 0700) Weight:  [137 lb 5.6 oz (62.3 kg)-138 lb 14.2 oz (63 kg)] 137 lb 5.6 oz (62.3 kg) (07/21 0425)  Intake/Output from previous day: 07/20 0701 - 07/21 0700 In: 1221 [P.O.:320; I.V.:901] Out: 820 [Urine:820]   Physical Exam: General: Well developed, well nourished, in no acute distress. Head:  Normocephalic and atraumatic. Lungs: Clear to auscultation and percussion. Heart: Normal S1 and S2.  No murmur, rubs or gallops.  Abdomen: soft, non-tender, positive bowel sounds. Extremities: No clubbing or cyanosis. No edema. Radial artery site and femoral artery site intact. Normal pulses. Neurologic: Alert and oriented x 3.    Lab Results:  Recent Labs  03/13/14 1310 03/13/14 1314 03/14/14 0255  WBC 19.4*  --  13.9*  HGB 12.4 13.6 11.7*  PLT 358  --  341    Recent Labs  03/13/14 1310 03/13/14 1314 03/14/14 0255  NA 137 139 139  K 3.7 3.6* 3.8  CL 100 101 101  CO2 21  --  22  GLUCOSE 141* 149* 92  BUN 11 10 10   CREATININE 0.38* 0.40* 0.48*    Recent Labs  03/13/14 2128 03/14/14 0255  TROPONINI >20.00* >20.00*   Hepatic Function Panel  Recent Labs  03/13/14 1310  PROT 6.6  ALBUMIN 3.6  AST 119*  ALT 18  ALKPHOS 95  BILITOT <0.2*    Recent Labs  03/14/14 0255  CHOL 182   LDL 123, 113  Telemetry: 03/13/14-7 beats VT nonsustained Personally viewed.   EKG:  03/14/14-sinus rhythm, poor R wave progression, persistent but improved ST elevation anterior precordial leads. Prior EKGs reviewed.  Cardiac Studies:  Cardiac catheterization 03/13/14: 2  DES to LAD. One hour later, repeat cardiac catheterization with acute stent thrombosis. Thrombectomy, balloon dilatation, IVUS  Assessment/Plan:  Active Problems:   ST elevation (STEMI) myocardial infarction involving left anterior descending coronary artery    43 year old female smoker with ST elevation myocardial infarction, anterior wall with drug-eluting stents x2 to LAD with subsequent acute stent thrombosis requiring aspiration thrombectomy, IVUS, balloon dilatation.   1. ST elevation myocardial infarction-she is finishing out Aggrastat for 18 hours during her acute stent thrombosis. Continue with dual antiplatelet therapy at least for one year. Continue with statin, beta blocker. Discussed case with Dr. Sanjuana KavaMcAlhaney. Troponin greater than 20. Echocardiogram pending. Discussed mortality associated with MI. We will keep in CCU today. Tomorrow plan on transferring to telemetry and possibly home on 03/16/14. Cardiac rehabilitation. Large overall infarction. I explained to them that she is likely to have wall motion abnormality on echocardiogram. She is at increased risk over the next 30 days.  2. Tobacco cessation. Counseling. Expressed the importance of tobacco cessation.  3. Nonsustained ventricular tachycardia-reperfusion. Potassium currently 3.8. I will replete.  Had lengthy discussion with her and her husband, Shannon [name]. Explained to them the hospital course.  Shannon [name] 03/14/2014, 8:25 AM

## 2014-03-15 DIAGNOSIS — I4729 Other ventricular tachycardia: Secondary | ICD-10-CM | POA: Diagnosis present

## 2014-03-15 DIAGNOSIS — I472 Ventricular tachycardia: Secondary | ICD-10-CM | POA: Diagnosis present

## 2014-03-15 MED ORDER — METOPROLOL SUCCINATE ER 50 MG PO TB24
50.0000 mg | ORAL_TABLET | Freq: Every day | ORAL | Status: DC
  Administered 2014-03-15: 50 mg via ORAL
  Filled 2014-03-15: qty 1

## 2014-03-15 MED ORDER — PRASUGREL HCL 10 MG PO TABS: 10.0000 mg | ORAL_TABLET | Freq: Every day | ORAL | Status: DC

## 2014-03-15 MED ORDER — ASPIRIN 81 MG PO CHEW: 81.0000 mg | CHEWABLE_TABLET | Freq: Every day | ORAL | Status: AC

## 2014-03-15 MED ORDER — NITROGLYCERIN 0.4 MG SL SUBL
SUBLINGUAL_TABLET | SUBLINGUAL | Status: AC
  Filled 2014-03-15: qty 1

## 2014-03-15 MED ORDER — METOPROLOL SUCCINATE ER 50 MG PO TB24: 50.0000 mg | ORAL_TABLET | Freq: Every day | ORAL | Status: DC

## 2014-03-15 MED ORDER — ATORVASTATIN CALCIUM 80 MG PO TABS: 80.0000 mg | ORAL_TABLET | Freq: Every day | ORAL | Status: DC

## 2014-03-15 MED ORDER — LISINOPRIL 5 MG PO TABS
5.0000 mg | ORAL_TABLET | Freq: Every day | ORAL | Status: DC
Start: 2014-03-15 — End: 2014-03-15
  Administered 2014-03-15: 5 mg via ORAL
  Filled 2014-03-15: qty 1

## 2014-03-15 MED ORDER — LISINOPRIL 5 MG PO TABS: 5.0000 mg | ORAL_TABLET | Freq: Every day | ORAL | Status: DC

## 2014-03-15 NOTE — Discharge Summary (Signed)
Personally seen and examined, agree with above. Anterior ST elevation myocardial infarction-EF 40-45% Continue to further uptitrate beta blocker and ACE inhibitor. No adverse arrhythmias overnight. Eager to go home, I'm fine with this.  Close followup. Out of work for 14 days, at the least for 7 days. Please see my note for full details. Donato SchultzSKAINS, MARK, MD

## 2014-03-15 NOTE — Progress Notes (Signed)
CARDIAC REHAB PHASE I   PRE:  Rate/Rhythm: 82 SR  BP:  Supine:   Sitting: 117/74  Standing:    SaO2:   MODE:  Ambulation: 700 ft   POST:  Rate/Rhythm: 89 SR  BP:  Supine:   Sitting: 117/83  Standing:    SaO2:  1135-1245 Pt walked 700 ft on RA with steady gait. No CP. Education completed with pt and husband who both voiced understanding. Discussed smoking cessation and gave handout. Encouraged pt to call 1800quitnow as needed and told her we have free classes through [facility]. Discussed CRP 2 and pt gave permission for referral to [place].  Stressed importance of not over doing and giving self time for healing. Husband very supportive. Gave stent card and pt has effient packet.   Luetta Nuttingharlene Mechel Haggard, RN BSN  03/15/2014 12:41 PM

## 2014-03-15 NOTE — Discharge Instructions (Signed)
May return to work on March 27, 2014.  Do not exceed more than 20 hours of work per week, until follow-up appointment.  Do not lift more than 10 lbs.  No strenuous upper body movements (pushing or pulling) until further notice.

## 2014-03-15 NOTE — Discharge Summary (Signed)
Physician Discharge Summary     Cardiologist:  McAlhany Patient ID: Shannon [name] MRN: [id number] DOB/AGE: [birth year] 43 y.o.  Admit date: 03/13/2014 Discharge date: 03/15/2014  Admission Diagnoses:  STEMI    Discharge Diagnoses:  Active Problems:   ST elevation (STEMI) myocardial infarction involving left anterior descending coronary artery   Tobacco use   NSVT (nonsustained ventricular tachycardia)   Discharged Condition: stable  Hospital Course:   43 yo female with history of tobacco abuse, subclavian artery stenosis s/p carotid/subclavian bypass here with ongoing chest pain. EKG with ST elevation anterior leads.  She was taken directly to the cath lab successful PTCA/DES x 2 mid LAD which was occluded.  She also has moderate RCA disease.  As the patient was being moved out of the cath lab she had onset of 10/10 chest pain. EKG with recurrent ST elevation anterior and inferior leads.  She was taken emergently back in and a second angiogram revealed acute stent thrombosis of the mid LAD stent.  Successful PTCA of the LAD stented segment following IVUS guidance, thrombectomy LAD.  She was started on BB, ASA, Effient.  Aggrastat was run for 18 hours.  She was counseled on tobacco cessation.  Echocardiogram revealed an EF of 40-45%, severe hypokinesis of the mid-apicalanteroseptal and apical myocardium. There is severe hypokinesis of the mid-apicallateral myocardium.  The patient was seen by Dr. Anne Fu who felt she was stable for DC home.    Consults:  Cardiac rehab   Significant Diagnostic Studies:   Cardiac Catheterization Operative Report  Shannon [name]  [id number]  7/20/201511:57 AM  No primary provider on file.  Procedure Performed:  1. Left Heart Catheterization 2. Selective Coronary Angiography 3. Left ventricular angiogram 4. PTCA/DES x 2 mid LAD 5. Angioseal RFA Operator: Verne Carrow, MD  Indication: 43 yo female with history of tobacco abuse presenting  with anterior STEMI.  Procedure Details:  The risks, benefits, complications, treatment options, and expected outcomes were discussed with the patient. Emergency consent obtained. The patient was brought to the cath lab via EMS. The patient was further sedated with Versed and Fentanyl. The right groin was prepped and draped in the usual manner. Using the modified Seldinger access technique, a 6 French sheath was placed in the right femoral artery. Standard diagnostic catheters were used to perform selective coronary angiography. She was found to have total occlusion of the mid LAD. I elected to perform PCI of the LAD.  PCI Note: The left main was engaged with a XB LAD 3.5 guiding catheter. She was given Effient 60 mg po x 1. She was given a weight based bolus of Angiomax and a drip was started. When the ACT was over 200, I passed a Cougar IC wire into the mid LAD. I could not pass the stenosis. I then used a 2.0 x 12 mm OTW balloon and a long Whisper wire to cross the lesion. The lesion was pre-dilated x 5. A 2.25 x 22 mm Resolute DES was deployed in the mid LAD. A 2.5 x 26 mm Resolute DES was deployed in the mid LAD overlapping on the proximal segment of the first stent. The distal stented segment was post-dilated with a 2.5 x 20 mm [place] balloon x 2. The proximal stented segment was post-dilated with a 2.75 x 20 mm [place] balloon x 2. The stent did not seem to be well expanded proximally so I used a 2.75 x 12 mm [place] balloon x 2 post-dilate the proximal stented segment. There  was an excellent angiographic result with TIMI-3 flow down the LAD. A pigtail catheter was used to perform a left ventricular angiogram.  There were no immediate complications. The patient was taken to the recovery area in stable condition.  Hemodynamic Findings:  Central aortic pressure: 134/78  Left ventricular pressure: 132/9/17  Angiographic Findings:  Left main: 30% distal stenosis.  Left Anterior Descending Artery: Large caliber vessel  that courses to the apex. The proximal vessel has diffuse 30% stenosis. There is a moderate caliber diagonal branch with ostial 50% stenosis. The mid LAD is totally occluded after the diagonal branch.  Circumflex Artery: Small to moderate caliber vessel with small Obtuse marginal branch. 40% mid stenosis.  Right Coronary Artery: Large dominant vessel with 50% proximal stenosis. 30% mid stenosis.  Left Ventricular Angiogram: LVEF=40% with hypokinesis of the anterior wall and apex.  Impression:  1. Acute anterior MI secondary to occluded mid LAD  2. Successful PTCA/DES x 2 mid LAD  3. Moderate disease RCA  4. Moderate segmental LV systolic dysfunction  Recommendations: To CCU. Echo in am. Start beta blocker and statin. Continue ASA, Effient.  Complications: None. The patient tolerated the procedure well.   Cardiac Catheterization Operative Report  Shannon [name]  [id number]  7/20/20151:10 PM  No primary provider on file.  Procedure Performed:  1. Selective coronary angiography  2. IVUS of the LAD  3. Aspiration thrombectomy mid LAD  4. PTCA of the mid LAD (balloon only with non-compliant balloon)  Operator: Verne Carrowhristopher McAlhany, MD  Access: right radial artery  Indication: 43 yo female admitted this morning with acute anterior STEMI. LAD was occluded. DES x 2 placed in the mid LAD. Pt was being moved out of the cath lab and had onset of 10/10 chest pain. EKG with recurrent ST elevation anterior and inferior leads.  Procedure Details:  Emergency consent obtained. Right wrist prepped and draped. 1% lidocaine used for local anesthesia. 5/6 french placed right radial artery. The patient was further sedated with Versed and Fentanyl. Selective angiography of the left coronary system with a XB LAD 3.5 guiding catheter. The LAD was found to be occluded.  PCI Note: She was given a bolus of Angiomax and a drip was started. She was given a bolus of Aggrastat and a drip was started. I then passed a  Cougar IC wire down the LAD. A 2.5 x 12 mm balloon was inflated x 3 in the mid stented segment to restore flow. I then passed a Priority aspiration catheter down the LAD and thrombus was extracted. I then performed an IVUS of the stented segment. No mechanical complications were seen. The stent was well apposed to the vessel wall but appeared to be underexpanded for the vessel size. I then post-dilated the entire stented segment with a 3.25 x 20 mm [place] balloon x 3. The final angiographic result was excellent. Final IVUS run demonstrated good stent expansion with good apposition to the vessel wall. Flow down the LAD was TIMI-3.  There were no immediate complications. The patient was taken to the recovery area in stable condition. Radial sheath was removed and a Terumo hemostasis band was applied to the radial artery.  Hemodynamic Findings:  Central aortic pressure: 134/77  Angiographic findings:  Left main: patent, mild distal disease  LAD: 100% mid occlusion in stent  Circumflex: patent with mild diffuse plaque  Impression:  1. Acute stent thrombosis of the mid LAD stent  2. Successful PTCA of the LAD stented segment following IVUS guidance,  thrombectomy LAD.  Recommendations: She will be admitted to the CCU. Echo in am. Statin, beta blocker, ASA, Effient. Will run Aggrastat for 18 hours.  Complications: None; patient tolerated the procedure well.  Echocardiogr am Study Conclusions  - Left ventricle: The cavity size was normal. Wall thickness was increased in a pattern of mild LVH. Systolic function was mildly to moderately reduced. The estimated ejection fraction was in the range of 40% to 45%. There is severe hypokinesis of the mid-apicalanteroseptal and apical myocardium. There is severe hypokinesis of the mid-apicallateral myocardium. Doppler parameters are consistent with abnormal left ventricular relaxation (grade 1 diastolic dysfunction).  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   Treatments: See above  Discharge Exam: Blood pressure 117/74, pulse 74, temperature 98.6 F (37 C), temperature source Oral, resp. rate 17, height 5\' 4"  (1.626 m), weight 134 lb 11.2 oz (61.1 kg), SpO2 98.00%.   Disposition:       Discharge Instructions   Amb Referral to Cardiac Rehabilitation    Complete by:  As directed      Diet - low sodium heart healthy    Complete by:  As directed      Discharge instructions    Complete by:  As directed   No lifting or driving for three days.     Increase activity slowly    Complete by:  As directed             Medication List         aspirin 81 MG chewable tablet  Chew 1 tablet (81 mg total) by mouth daily.     aspirin-acetaminophen-caffeine 250-250-65 MG per tablet  Commonly known as:  EXCEDRIN MIGRAINE  Take 2 tablets by mouth every 6 (six) hours as needed for headache.     atorvastatin 80 MG tablet  Commonly known as:  LIPITOR  Take 1 tablet (80 mg total) by mouth daily at 6 PM.     cyclobenzaprine 10 MG tablet  Commonly known as:  FLEXERIL  Take 10 mg by mouth 3 (three) times daily as needed for muscle spasms.     lisinopril 5 MG tablet  Commonly known as:  PRINIVIL,ZESTRIL  Take 1 tablet (5 mg total) by mouth daily.     metoprolol succinate 50 MG 24 hr tablet  Commonly known as:  TOPROL-XL  Take 1 tablet (50 mg total) by mouth daily. Take with or immediately following a meal.     prasugrel 10 MG Tabs tablet  Commonly known as:  EFFIENT  Take 1 tablet (10 mg total) by mouth daily.       Follow-up Information   Follow up with Ronie Spies, PA-C On 03/31/2014. (11:30 AM)    Specialty:  Cardiology   Contact information:   [address] [place] [address] [phone]      Greater than 30 minutes was spent completing the patient's  discharge.   SignedWilburt Finlay, PA-C  03/15/2014, 1:58 PM

## 2014-03-15 NOTE — Progress Notes (Signed)
Cardiologist: New, Dr.Eriyanna Kofoed  Subjective:  No chest pain currently. Feeling well. Her husband [name] is at bedside. No shortness of breath. She asked yesterday about smoking/vapor cigarettes. She is very eager to go home.  Objective:  Vital Signs in the last 24 hours: Temp:  [98.1 F (36.7 C)-99 F (37.2 C)] 98.4 F (36.9 C) (07/22 0732) Pulse Rate:  [65-96] 74 (07/22 0800) Resp:  [9-32] 12 (07/22 0800) BP: (91-144)/(54-103) 124/85 mmHg (07/22 0800) SpO2:  [96 %-99 %] 96 % (07/22 0700) Weight:  [134 lb 11.2 oz (61.1 kg)] 134 lb 11.2 oz (61.1 kg) (07/22 0300)  Intake/Output from previous day: 07/21 0701 - 07/22 0700 In: 570 [P.O.:560; I.V.:10] Out: 1275 [Urine:1275]   Physical Exam: General: Well developed, well nourished, in no acute distress. Head:  Normocephalic and atraumatic. Lungs: Clear to auscultation and percussion. Heart: Normal S1 and S2.  No murmur, rubs or gallops.  Abdomen: soft, non-tender, positive bowel sounds. Extremities: No clubbing or cyanosis. No edema. Radial artery site and femoral artery site intact. Normal pulses. Neurologic: Alert and oriented x 3.    Lab Results:  Recent Labs  03/13/14 1310 03/13/14 1314 03/14/14 0255  WBC 19.4*  --  13.9*  HGB 12.4 13.6 11.7*  PLT 358  --  341    Recent Labs  03/13/14 1310 03/13/14 1314 03/14/14 0255  NA 137 139 139  K 3.7 3.6* 3.8  CL 100 101 101  CO2 21  --  22  GLUCOSE 141* 149* 92  BUN 11 10 10   CREATININE 0.38* 0.40* 0.48*    Recent Labs  03/13/14 2128 03/14/14 0255  TROPONINI >20.00* >20.00*   Hepatic Function Panel  Recent Labs  03/13/14 1310  PROT 6.6  ALBUMIN 3.6  AST 119*  ALT 18  ALKPHOS 95  BILITOT <0.2*    Recent Labs  03/14/14 0255  CHOL 182   LDL 123, 113  Telemetry: 03/13/14-7 beats VT nonsustained. No further arrhythmias Personally viewed.   EKG:  03/14/14-sinus rhythm, poor R wave progression, persistent but improved ST elevation anterior precordial  leads. Prior EKGs reviewed.  Cardiac Studies:  Cardiac catheterization 03/13/14: 2 DES to LAD. One hour later, repeat cardiac catheterization with acute stent thrombosis. Thrombectomy, balloon dilatation, IVUS  Echocardiogram: 03/14/14-ejection fraction 40-45% with severe hypokinesis of the mid apical anteroseptal and apical myocardium.  Assessment/Plan:  Active Problems:   ST elevation (STEMI) myocardial infarction involving left anterior descending coronary artery   Tobacco use    43 year old female smoker with ST elevation myocardial infarction, anterior wall with drug-eluting stents x2 to LAD with subsequent acute stent thrombosis requiring aspiration thrombectomy, IVUS, balloon dilatation.   1. ST elevation myocardial infarction- Continue with dual antiplatelet therapy at least for one year. Continue with statin, beta blocker. Discussed case with Dr. Sanjuana KavaMcAlhaney. Troponin greater than 20. Echocardiogram shows anterior wall motion abnormality with ejection fraction of 40-45%. Discussed mortality associated with MI.  Aspirin Effient, atorvastatin 80, lisinopril 5 mg, metoprolol succinate 50 mg a day.  Cardiac rehabilitation.  I'm fine with her not going to work for the next 14 days. She works at a gym. 7 day work excuse at the least.  2. Tobacco cessation. Counseling. Expressed the importance of tobacco cessation. She has done a good job here in the hospital. Encouraged her to buy a calendar and to accept the days that she is not smoking. She has motivation from her husband.  3. No further arrhythmias on telemetry  Had lengthy discussion with her  and her husband, Loraine Leriche.   I'm ready for her to be discharged. She is eager to be discharged.  Followup in 7 days in cardiology clinic, APP ok or me.  Holy Battenfield 03/15/2014, 11:15 AM

## 2014-03-15 NOTE — Progress Notes (Signed)
CRITICAL CARE Performed by: Excell SeltzerFurlow, Lawren Sexson S   Total critical care time:Katira Alexander  Critical care time was exclusive of separately billable procedures and treating other patients.  Critical care was necessary to treat or prevent imminent or life-threatening deterioration.  Critical care was time spent personally by me on the following activities: development of treatment plan with patient and/or surrogate as well as nursing, discussions with consultants, evaluation of patient's response to treatment, examination of patient, obtaining history from patient or surrogate, ordering and performing treatments and interventions, ordering and review of laboratory studies, ordering and review of radiographic studies, pulse oximetry and re-evaluation of patient's condition.

## 2014-03-17 ENCOUNTER — Telehealth: Payer: Self-pay | Admitting: Cardiovascular Disease

## 2014-03-17 NOTE — Telephone Encounter (Signed)
°  Patient had heart attack on Monday had stent put in, She is on all new medications Lisinopril, effient, Lipitor and toprol. She also went from smoking a pack of cigarettes a day to nothing. She is feeling GREAT... Except she is nausated. Please call and advise.

## 2014-03-17 NOTE — Telephone Encounter (Signed)
Called stating she had a heart attack and was d/c on Mon 03/15/14.  States she was 1 pk a day smoker and quit "cold [place]".  Since her d/c from hospital she has been nauseated especially when she wakes up in the morning.  She is wondering if could be because she quit smoking. Reviewed her medications. She does not have a PCP. Spoke w/Dr. Patty SermonsBrackbill (DOD) who also reviewed meds.  Doesn't feel like any of meds could be causing nausea. Suggested that she try OTC Nicoderm patch or Nicorette gum to see if that helped with nausea.  She will try and if continues to be nauseated will call back.  Has an appointment with Ronie Spiesayna Dunn on 8/7.

## 2014-03-20 ENCOUNTER — Telehealth: Payer: Self-pay | Admitting: Cardiovascular Disease

## 2014-03-20 NOTE — Telephone Encounter (Signed)
Reviewed information with pt who states understanding.  She will call back in the AM if no better after the warm compresses.

## 2014-03-20 NOTE — Telephone Encounter (Signed)
New Message  Pt called reports elbow and wrist pain. Pt says that she had shoulder pain previously where she was given the muscle relaxer Cyclobenzaprine// Pt requests a call back to discuss if she can take this medication for her current pain. Please call

## 2014-03-20 NOTE — Telephone Encounter (Signed)
Per pt call - states that she has been having pain and discomfort in her arm that was used for stent placement last Monday.  She is not having any problems with her hand but has been having some tightness and swelling in her forearm.  Their is a bruise at the insertion site.  Today so far it has not really bothered her but yesterday she was having a continued aching discomfort.  Advised I will review with MD to see if there is anything specifically she should try. She reports she is continuing to have shoulder pain from an injury at work in June.  Advised OK to use cyclobenzaprine however it would not be helpful with the discomfort she is having in her arm from the cath site.  She states understanding.

## 2014-03-20 NOTE — Telephone Encounter (Signed)
She can put a warm compress on it. If it overall soft and minimal bruising, should be ok. If it becomes more painful or swelling worsens, call back and I can see her in the office tomorrow afternoon to look at it. Thayer Ohmhris

## 2014-03-28 ENCOUNTER — Telehealth: Payer: Self-pay | Admitting: Cardiovascular Disease

## 2014-03-28 NOTE — Telephone Encounter (Signed)
New message    Patient calling H/O MI x 2 weeks.   Patient  return to work on 8/3 . Just felt bad after working 4 hour shift.   Has appt on Dayana Dunn on Friday. 8/7 .    Need a note to be out of work until Friday.

## 2014-03-29 ENCOUNTER — Encounter: Payer: Self-pay | Admitting: *Deleted

## 2014-03-29 NOTE — Telephone Encounter (Signed)
Spoke with pt who reports she returned to work earlier this week and feels she returned too early. 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 Today she is feeling well. Will write letter keeping pt out of work until seen in office on August 7. Return to work will be determined at that office visit. Will leave letter at front desk for pt to pick up.

## 2014-03-31 ENCOUNTER — Encounter: Payer: Self-pay | Admitting: Physician Assistant

## 2014-03-31 ENCOUNTER — Ambulatory Visit (INDEPENDENT_AMBULATORY_CARE_PROVIDER_SITE_OTHER): Payer: BC Managed Care – PPO | Admitting: Physician Assistant

## 2014-03-31 VITALS — BP 120/70 | HR 83 | Ht 63.0 in | Wt 134.8 lb

## 2014-03-31 DIAGNOSIS — Z720 Tobacco use: Secondary | ICD-10-CM | POA: Insufficient documentation

## 2014-03-31 DIAGNOSIS — I209 Angina pectoris, unspecified: Secondary | ICD-10-CM

## 2014-03-31 DIAGNOSIS — E785 Hyperlipidemia, unspecified: Secondary | ICD-10-CM

## 2014-03-31 DIAGNOSIS — F17200 Nicotine dependence, unspecified, uncomplicated: Secondary | ICD-10-CM

## 2014-03-31 DIAGNOSIS — I779 Disorder of arteries and arterioles, unspecified: Secondary | ICD-10-CM

## 2014-03-31 DIAGNOSIS — I2589 Other forms of chronic ischemic heart disease: Secondary | ICD-10-CM

## 2014-03-31 DIAGNOSIS — I739 Peripheral vascular disease, unspecified: Secondary | ICD-10-CM | POA: Insufficient documentation

## 2014-03-31 DIAGNOSIS — I2510 Atherosclerotic heart disease of native coronary artery without angina pectoris: Secondary | ICD-10-CM

## 2014-03-31 DIAGNOSIS — I25119 Atherosclerotic heart disease of native coronary artery with unspecified angina pectoris: Secondary | ICD-10-CM

## 2014-03-31 DIAGNOSIS — I255 Ischemic cardiomyopathy: Secondary | ICD-10-CM

## 2014-03-31 NOTE — Patient Instructions (Signed)
Your physician recommends that you continue on your current medications as directed. Please refer to the Current Medication list given to you today.  Your physician recommends that you keep your scheduled  follow-up appointment; refer to the appointment listed above.    Your physician recommends that you return for a FASTING lipid profile: the same day you have follow up appointment.   No lifting x 1 week over 10 lbs.  May return to work in two weeks, patient given work note today.

## 2014-03-31 NOTE — Progress Notes (Addendum)
[address] [place], [place]  [address] Phone: [phone] Fax:  [phone]  Date:  03/31/2014   Patient ID:  Noma, Quijas October 31, 1970, MRN [id number]   PCP:  No primary provider on file.  Cardiologist:  Dr. Clifton James  History of Present Illness: LIOR CARTELLI is a 43 y.o. female with recent anterior STEMI s/p DESx2 to LAD c/b acute stent thrombosis shortly after initial requiring repeat PTCA/thrombectomy, ICM EF 40-45%, tobacco abuse, prior subclavian bypass for stenosis who presents for hospital followup. She denies further chest pain or dyspnea. No LEE, orthopnea, PND or syncope. She feels that she returned to work too early. 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 She did not have any CP or SOB, just felt somewhat weak. She called into the office and was advised that we would discuss at this visit. BP is controlled today at 120/70. She has not yet taken her AM meds but endorses compliance.  Recent Labs: 03/13/2014: ALT 18  03/14/2014: Creatinine 0.48*; HDL Cholesterol by NMR 33*; Hemoglobin 11.7*; LDL (calc) 113*; Potassium 3.8   Wt Readings from Last 3 Encounters:  03/31/14 134 lb 12.8 oz (61.145 kg)  03/15/14 134 lb 11.2 oz (61.1 kg)  03/15/14 134 lb 11.2 oz (61.1 kg)     Past Medical History  Diagnosis Date  . Tobacco abuse   . Subclavian artery disease     a. H/o subclavian artery stenosis s/p carotid/subclavian bypass.  Marland Kitchen NSVT (nonsustained ventricular tachycardia)   . CAD (coronary artery disease)     a. Anterior STEMI s/p PTCA/DESx2 to mLAD, residual moderate LAD disease - recurrent pain/ST elevation with emergent re-cath with acute stent thrombosis s/p PTCA, IVUS, thrombectomy of LAD.   . Ischemic cardiomyopathy     a. 02/2014: EF 40-45%, severe hypokinesis of mid-apical anteroseptal and apical myocardium, severe HK of mid-apical lateral myocardium.  Marland Kitchen Dyslipidemia     Current Outpatient Prescriptions  Medication  Sig Dispense Refill  . aspirin 81 MG chewable tablet Chew 1 tablet (81 mg total) by mouth daily.      Marland Kitchen atorvastatin (LIPITOR) 80 MG tablet Take 1 tablet (80 mg total) by mouth daily at 6 PM.  30 tablet  5  . cyclobenzaprine (FLEXERIL) 10 MG tablet Take 10 mg by mouth 3 (three) times daily as needed for muscle spasms.      Marland Kitchen ibuprofen (ADVIL,MOTRIN) 200 MG tablet Take 200 mg by mouth as needed.      Marland Kitchen lisinopril (PRINIVIL,ZESTRIL) 5 MG tablet Take 1 tablet (5 mg total) by mouth daily.  30 tablet  5  . metoprolol succinate (TOPROL-XL) 50 MG 24 hr tablet Take 1 tablet (50 mg total) by mouth daily. Take with or immediately following a meal.  30 tablet  5  . prasugrel (EFFIENT) 10 MG TABS tablet Take 1 tablet (10 mg total) by mouth daily.  30 tablet  10   No current facility-administered medications for this visit.    Allergies:   Review of patient's allergies indicates no known allergies.   Social History:  The patient  reports that she has been smoking Cigarettes.  She has a 25 pack-year smoking history. She uses smokeless tobacco. She reports that she does not drink alcohol or use illicit drugs.   Family History:  The patient's family history includes CAD in her father.   ROS:  Please see the history of present illness.   All other  systems reviewed and negative.   PHYSICAL EXAM:  VS:  BP 120/70  Pulse 83  Ht 5\' 3"  (1.6 m)  Wt 134 lb 12.8 oz (61.145 kg)  BMI 23.88 kg/m2 Well nourished, well developed thin WF, in no acute distress HEENT: normal Neck: no JVD Cardiac:  normal S1, S2; RRR; no murmur Lungs:  clear to auscultation bilaterally, no wheezing, rhonchi or rales Abd: soft, nontender, no hepatomegaly Ext: no edema, R radial site free of ecchymosis or hematoma (good pulse), R femoral cath site well healing without ecchymosis bruit or hematoma Skin: warm and dry Neuro:  moves all extremities spontaneously, no focal abnormalities noted  EKG:  NSR 83bpm, inferior infarct age  undetermined, anteroseptal infarct age undetermined, inferior TWI II III avF, as well as V3-V6 but improved from prior tracing.    ASSESSMENT AND PLAN:  1. CAD with recent anterior STEMI s/p DESx2 to LAD c/b acute stent thrombosis s/p PTCA/thrombectomy 2. Tobacco abuse 3. Ischemic cardiomyopathy 4. Subclavian artery stenosis 5. Dyslipidemia  Doing well overall but did not tolerate standing for long periods of time at her job. I will keep her out of work for another 2 weeks which will give her time to enroll in cardiac rehab and hopefully gain some endurance. She also plans on using this time to re-evaluate work ergonomics and also potential alternatives to her current employment at [employer] desk. No lifting over 10lbs for 1 more week. Continue current med regimen. I will not titrate meds further right now given that weak feeling she has had with prolonged standing. BP is controlled. I also advised alternatives to ibuprofen (like tylenol) which she only takes sporadically, given concomitant ASA/Effient. I encouraged her to continue to try to quit smoking including the vapor cigs she is using. We can consider reassessing EF in several months.  Dispo: F/u in 6-8 weeks at which time we'll repeat FLP/CMET. Clifton JamesMcAlhany is booked out for several months so she will come back in September to see me.  Signed, Ronie Spiesayna Dunn, PA-C  03/31/2014 12:24 PM

## 2014-04-06 ENCOUNTER — Encounter (HOSPITAL_COMMUNITY)
Admission: RE | Admit: 2014-04-06 | Discharge: 2014-04-06 | Disposition: A | Discharge status: Home / Self Care | Payer: BC Managed Care – PPO | Source: Ambulatory Visit | Attending: Cardiovascular Disease | Admitting: Cardiovascular Disease

## 2014-04-06 DIAGNOSIS — Z5189 Encounter for other specified aftercare: Secondary | ICD-10-CM | POA: Insufficient documentation

## 2014-04-06 DIAGNOSIS — I779 Disorder of arteries and arterioles, unspecified: Secondary | ICD-10-CM | POA: Insufficient documentation

## 2014-04-06 DIAGNOSIS — I472 Ventricular tachycardia, unspecified: Secondary | ICD-10-CM | POA: Insufficient documentation

## 2014-04-06 DIAGNOSIS — I4729 Other ventricular tachycardia: Secondary | ICD-10-CM | POA: Insufficient documentation

## 2014-04-06 DIAGNOSIS — I2589 Other forms of chronic ischemic heart disease: Secondary | ICD-10-CM | POA: Insufficient documentation

## 2014-04-06 DIAGNOSIS — I2510 Atherosclerotic heart disease of native coronary artery without angina pectoris: Secondary | ICD-10-CM | POA: Insufficient documentation

## 2014-04-06 DIAGNOSIS — I2109 ST elevation (STEMI) myocardial infarction involving other coronary artery of anterior wall: Secondary | ICD-10-CM | POA: Insufficient documentation

## 2014-04-06 NOTE — Progress Notes (Signed)
Cardiac Rehab Medication Review by a Pharmacist  Does the patient  feel that his/her medications are working for him/her?  yes  Has the patient been experiencing any side effects to the medications prescribed?  no  Does the patient measure his/her own blood pressure or blood glucose at home?  no   Does the patient have any problems obtaining medications due to transportation or finances?   no  Understanding of regimen: good Understanding of indications: excellent Potential of compliance: excellent    Pharmacist comments: Patient appears to have good understanding of medication regimen.  She states that the headaches she was experiencing prior to hospitalization have resolved.  She does have trouble sleeping but is trying melatonin.  I encouraged her to talk about other options with her MD should she continue to have difficulty sleeping.   [name], PharmD Clinical Pharmacist - Resident Pager: [phone] 8/13/20158:45 AM

## 2014-04-10 ENCOUNTER — Encounter (HOSPITAL_COMMUNITY): Payer: Self-pay

## 2014-04-10 ENCOUNTER — Encounter (HOSPITAL_COMMUNITY)
Admission: RE | Admit: 2014-04-10 | Discharge: 2014-04-10 | Disposition: A | Discharge status: Home / Self Care | Payer: BC Managed Care – PPO | Source: Ambulatory Visit | Attending: Cardiovascular Disease | Admitting: Cardiovascular Disease

## 2014-04-10 DIAGNOSIS — I4729 Other ventricular tachycardia: Secondary | ICD-10-CM | POA: Diagnosis not present

## 2014-04-10 DIAGNOSIS — I2109 ST elevation (STEMI) myocardial infarction involving other coronary artery of anterior wall: Secondary | ICD-10-CM | POA: Diagnosis present

## 2014-04-10 DIAGNOSIS — I779 Disorder of arteries and arterioles, unspecified: Secondary | ICD-10-CM | POA: Diagnosis not present

## 2014-04-10 DIAGNOSIS — I2510 Atherosclerotic heart disease of native coronary artery without angina pectoris: Secondary | ICD-10-CM | POA: Diagnosis not present

## 2014-04-10 DIAGNOSIS — I2589 Other forms of chronic ischemic heart disease: Secondary | ICD-10-CM | POA: Diagnosis not present

## 2014-04-10 DIAGNOSIS — I472 Ventricular tachycardia: Secondary | ICD-10-CM | POA: Diagnosis not present

## 2014-04-10 DIAGNOSIS — Z5189 Encounter for other specified aftercare: Secondary | ICD-10-CM | POA: Diagnosis present

## 2014-04-10 NOTE — Progress Notes (Signed)
Pt started cardiac rehab today.  Pt tolerated light exercise without difficulty.  VSS, telemetry-NSR with T wave inversion. Asymptomatic.  PHQ-9.  Pt admits to previous medication for depression and anxiety ineffective with difficult side effects. Pt mother passed away suddenly 2 years ago and her father in law was murdered a few years ago.  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  Pt offered referral to behavioral health or spiritual care department.  Pt declines offer for spiritual care however expresses some interest in behavioral health. Pt will let us know when ready to schedule appointment.  Pt also having difficulty accepting her illness and the associated changes.  Pt offered emotional support and reassurance.  Pt has quit smoking.  Pt reports she was smoking heavy prior to her MI and reports cessation  has been difficult causing increased anxiety. Pt uses a vapor, pt counseled about harmful effects of vapor ecigarettes.  Pt offered emotional support advised to contact 1800quitnow.  Pt oriented to exercise equipment and routine.  Understanding verbalized.

## 2014-04-11 ENCOUNTER — Telehealth: Payer: Self-pay | Admitting: Cardiovascular Disease

## 2014-04-11 NOTE — Telephone Encounter (Signed)
Pt calling Dr Clifton JamesMcAlhany and nurse to ask for a work note to be written as return to work only for 20-25 hours per week, until her cardiac rehab is complete in November. The pt states she is supposed to return to work on Monday with no restrictions, but pt states she is doing cardiac rehab 3 times per week, and its completely draining her, and she feels like she can't return to full time hours and continue doing the rehab at the same time.  Pt states she works at [employer], standing on her feet 8 hours a day.  Pt states she does not want to give up cardiac rehab, for she wants a full recovery from her 2 stents that were recently placed.  Informed pt that Dr Clifton JamesMcAlhany and nurse are both out of the office today, but I will forward this message to them for further review and recommendation and follow-up thereafter.  Pt verbalized understanding and agrees with this plan.

## 2014-04-11 NOTE — Telephone Encounter (Signed)
New message    Patient calling need a note return to work . Will explain when the nurse call back.

## 2014-04-12 ENCOUNTER — Other Ambulatory Visit (HOSPITAL_COMMUNITY): Payer: Self-pay | Admitting: Physician Assistant

## 2014-04-12 ENCOUNTER — Encounter (HOSPITAL_COMMUNITY)
Admission: RE | Admit: 2014-04-12 | Discharge: 2014-04-12 | Disposition: A | Discharge status: Home / Self Care | Payer: BC Managed Care – PPO | Source: Ambulatory Visit | Attending: Cardiovascular Disease | Admitting: Cardiovascular Disease

## 2014-04-12 ENCOUNTER — Encounter: Payer: Self-pay | Admitting: *Deleted

## 2014-04-12 DIAGNOSIS — Z5189 Encounter for other specified aftercare: Secondary | ICD-10-CM | POA: Diagnosis not present

## 2014-04-12 NOTE — Telephone Encounter (Signed)
Dennie Bibleat, can we write her a note stating the reduced work hours? Thanks, chris

## 2014-04-12 NOTE — Telephone Encounter (Signed)
Spoke with pt. Letter to be written indicating she can only work 20-25 hours per week until the end November. Will leave at front desk for her to pick up this afternoon.

## 2014-04-13 ENCOUNTER — Other Ambulatory Visit (HOSPITAL_COMMUNITY): Payer: Self-pay | Admitting: Physician Assistant

## 2014-04-14 ENCOUNTER — Encounter (HOSPITAL_COMMUNITY)
Admission: RE | Admit: 2014-04-14 | Discharge: 2014-04-14 | Disposition: A | Discharge status: Home / Self Care | Payer: BC Managed Care – PPO | Source: Ambulatory Visit | Attending: Cardiovascular Disease | Admitting: Cardiovascular Disease

## 2014-04-14 ENCOUNTER — Telehealth: Payer: Self-pay | Admitting: Cardiovascular Disease

## 2014-04-14 DIAGNOSIS — Z5189 Encounter for other specified aftercare: Secondary | ICD-10-CM | POA: Diagnosis not present

## 2014-04-14 NOTE — Telephone Encounter (Signed)
Spoke with Joann at Cardiac Rehab. Pt arrived at cardiac rehab today complaining of shoulder discomfort. Pain went away with activity. She was able to exercise without problems.

## 2014-04-14 NOTE — Telephone Encounter (Signed)
New message      Want doctor to know that she was having shoulder discomfort during exercise ---it went away on its own

## 2014-04-14 NOTE — Progress Notes (Signed)
Pt arrived at cardiac rehab today c/o right shoulder pain earlier today. Pt reports the pain is worse when sitting and improves with standing and walking.  Pt denies pain at the present time.  Pt denies chest pain or other symptoms. Pt able to exercise without difficulty, pain free.   [facility] Dr. Gibson RampMcAlhany's nurse to make aware.  Pt instructed to present to ED for worsening unrelieved symptoms.  Pt verbalized understanding.

## 2014-04-17 ENCOUNTER — Encounter (HOSPITAL_COMMUNITY): Admission: RE | Admit: 2014-04-17 | Payer: BC Managed Care – PPO | Source: Ambulatory Visit

## 2014-04-17 NOTE — Telephone Encounter (Signed)
Thanks, cdm 

## 2014-04-19 ENCOUNTER — Encounter (HOSPITAL_COMMUNITY)
Admission: RE | Admit: 2014-04-19 | Discharge: 2014-04-19 | Disposition: A | Discharge status: Home / Self Care | Payer: BC Managed Care – PPO | Source: Ambulatory Visit | Attending: Cardiovascular Disease | Admitting: Cardiovascular Disease

## 2014-04-19 DIAGNOSIS — Z5189 Encounter for other specified aftercare: Secondary | ICD-10-CM | POA: Diagnosis not present

## 2014-04-19 NOTE — Progress Notes (Signed)
Osiris had some exertional and resting systolic blood pressure in the 90's.today at cardiac rehab. Patient asymptomatic and given Gatorade repeat blood pressure 113/76. Will fax exercise flow sheets to Dr. Erlinda Hong Alhany's office for review.

## 2014-04-20 ENCOUNTER — Telehealth: Payer: Self-pay | Admitting: *Deleted

## 2014-04-20 MED ORDER — LISINOPRIL 2.5 MG PO TABS: 2.5000 mg | ORAL_TABLET | Freq: Every day | ORAL | Status: DC

## 2014-04-20 NOTE — Telephone Encounter (Signed)
Received fax from cardiac rehab of low blood pressures at rehab. Pt also complained of some dizziness when changing positions. Per Dr. Clifton James pt should decrease lisinopril to 2.5 mg by mouth daily. I spoke with pt and gave her these instructions.

## 2014-04-21 ENCOUNTER — Encounter (HOSPITAL_COMMUNITY)
Admission: RE | Admit: 2014-04-21 | Discharge: 2014-04-21 | Disposition: A | Discharge status: Home / Self Care | Payer: BC Managed Care – PPO | Source: Ambulatory Visit | Attending: Cardiovascular Disease | Admitting: Cardiovascular Disease

## 2014-04-21 DIAGNOSIS — Z5189 Encounter for other specified aftercare: Secondary | ICD-10-CM | POA: Diagnosis not present

## 2014-04-21 NOTE — Progress Notes (Signed)
Reviewed home exercise guidelines with patient including endpoints, temperature precautions, target heart rate and rate of perceived exertion. Pt plans to walk as her mode of home exercise and has access to a gym and exercise equipment at home. Pt voices understanding of instructions given. Artist Pais, MS, ACSM CCEP

## 2014-04-24 ENCOUNTER — Encounter (HOSPITAL_COMMUNITY)
Admission: RE | Admit: 2014-04-24 | Discharge: 2014-04-24 | Disposition: A | Discharge status: Home / Self Care | Payer: BC Managed Care – PPO | Source: Ambulatory Visit | Attending: Cardiovascular Disease | Admitting: Cardiovascular Disease

## 2014-04-24 DIAGNOSIS — Z5189 Encounter for other specified aftercare: Secondary | ICD-10-CM | POA: Diagnosis not present

## 2014-04-24 NOTE — Progress Notes (Signed)
Quality of life questionnaire reviewed with pt.  Pt has low scores throughout survey, primarily in socioeconomic. Will forward results to Dr. Clifton James.  Pt reports she is starting to feel improved outlook since resuming work and starting cardiac rehab however she continues to c/o daily fatigue.  Pt has supportive family.  Offered emotional support and reassurance.  Will continue to monitor.

## 2014-04-26 ENCOUNTER — Encounter (HOSPITAL_COMMUNITY)
Admission: RE | Admit: 2014-04-26 | Discharge: 2014-04-26 | Disposition: A | Discharge status: Home / Self Care | Payer: BC Managed Care – PPO | Source: Ambulatory Visit | Attending: Cardiovascular Disease | Admitting: Cardiovascular Disease

## 2014-04-26 DIAGNOSIS — I779 Disorder of arteries and arterioles, unspecified: Secondary | ICD-10-CM | POA: Insufficient documentation

## 2014-04-26 DIAGNOSIS — I472 Ventricular tachycardia, unspecified: Secondary | ICD-10-CM | POA: Insufficient documentation

## 2014-04-26 DIAGNOSIS — I4729 Other ventricular tachycardia: Secondary | ICD-10-CM | POA: Insufficient documentation

## 2014-04-26 DIAGNOSIS — Z5189 Encounter for other specified aftercare: Secondary | ICD-10-CM | POA: Diagnosis present

## 2014-04-26 DIAGNOSIS — I2510 Atherosclerotic heart disease of native coronary artery without angina pectoris: Secondary | ICD-10-CM | POA: Diagnosis not present

## 2014-04-26 DIAGNOSIS — I2589 Other forms of chronic ischemic heart disease: Secondary | ICD-10-CM | POA: Diagnosis not present

## 2014-04-26 DIAGNOSIS — I2109 ST elevation (STEMI) myocardial infarction involving other coronary artery of anterior wall: Secondary | ICD-10-CM | POA: Insufficient documentation

## 2014-04-28 ENCOUNTER — Encounter (HOSPITAL_COMMUNITY)
Admission: RE | Admit: 2014-04-28 | Discharge: 2014-04-28 | Disposition: A | Discharge status: Home / Self Care | Payer: BC Managed Care – PPO | Source: Ambulatory Visit | Attending: Cardiovascular Disease | Admitting: Cardiovascular Disease

## 2014-04-28 DIAGNOSIS — Z5189 Encounter for other specified aftercare: Secondary | ICD-10-CM | POA: Diagnosis not present

## 2014-05-01 ENCOUNTER — Encounter (HOSPITAL_COMMUNITY): Payer: BC Managed Care – PPO

## 2014-05-03 ENCOUNTER — Encounter (HOSPITAL_COMMUNITY)
Admission: RE | Admit: 2014-05-03 | Discharge: 2014-05-03 | Disposition: A | Discharge status: Home / Self Care | Payer: BC Managed Care – PPO | Source: Ambulatory Visit | Attending: Cardiovascular Disease | Admitting: Cardiovascular Disease

## 2014-05-03 DIAGNOSIS — Z5189 Encounter for other specified aftercare: Secondary | ICD-10-CM | POA: Diagnosis not present

## 2014-05-03 NOTE — Progress Notes (Signed)
Judieth Keens 43 y.o. female Nutrition Note Spoke with pt.  Nutrition Survey reviewed with pt. Pt is following Step 1 of the Therapeutic Lifestyle Changes diet. Per discussion, pt consumes a significant amount of sugar on a daily basis (via [employer] that she drinks "all day"). Pt educated re: sugar intake and the recommendation to decrease sugar consumption for heart health. Pt expressed understanding of the information reviewed. Pt aware of nutrition education classes offered.  Nutrition Diagnosis   Food-and nutrition-related knowledge deficit related to lack of exposure to information as related to diagnosis of: ? CVD ?   Nutrition Intervention   Benefits of adopting Therapeutic Lifestyle Changes discussed when Medficts reviewed.   Pt to attend the Portion Distortion class   Pt to attend the  ? Nutrition I class                     ? Nutrition II class   Pt given handouts for: ? Nutrition I class ? Nutrition II class    Continue client-centered nutrition education by RD, as part of interdisciplinary care.  Goal(s)   Pt to choose more whole grains.   Pt to go easy on high fat cheeses.   Pt to choose trans fat-free margarine.   Pt to watch out for sweets and added sugar.   Pt to choose healthy drinks.   Pt to identify and limit food sources of saturated fat, trans fat, and cholesterol  Monitor and Evaluate progress toward nutrition goal with team. Nutrition Risk:  Low   Mickle Plumb, M.Ed, RD, LDN, CDE 05/03/2014 2:21 PM

## 2014-05-03 NOTE — Progress Notes (Signed)
Follow up quality of life assessment.  Pt continues to report significant stressors in her life, at work and home.  Pt is often overwhelmed.  She has difficult working situation however does not feel ready to leave and find new employment, she is willing to work through the issues as there are more good than bad at her present employer. Pt also reports financial strain from missed work due to illness.  Pt however exhibits overall positive coping skills and improving outlook.  Pt reminded of relaxation techniques for stress reduction.  Pt prefers music, aromatherapy and pampers herself with manicures.  Will continue to monitor.

## 2014-05-05 ENCOUNTER — Encounter (HOSPITAL_COMMUNITY)
Admission: RE | Admit: 2014-05-05 | Discharge: 2014-05-05 | Disposition: A | Discharge status: Home / Self Care | Payer: BC Managed Care – PPO | Source: Ambulatory Visit | Attending: Cardiovascular Disease | Admitting: Cardiovascular Disease

## 2014-05-05 DIAGNOSIS — Z5189 Encounter for other specified aftercare: Secondary | ICD-10-CM | POA: Diagnosis not present

## 2014-05-08 ENCOUNTER — Encounter (HOSPITAL_COMMUNITY): Payer: BC Managed Care – PPO

## 2014-05-08 ENCOUNTER — Telehealth (HOSPITAL_COMMUNITY): Payer: Self-pay

## 2014-05-10 ENCOUNTER — Encounter (HOSPITAL_COMMUNITY): Payer: BC Managed Care – PPO

## 2014-05-10 ENCOUNTER — Telehealth (HOSPITAL_COMMUNITY): Payer: Self-pay | Admitting: Cardiac Rehabilitation

## 2014-05-10 NOTE — Telephone Encounter (Signed)
pc received from pt reporting absence from cardiac rehab Monday and today due to head cold and nasal congestion.  Pt will return once symptom free for 24 hours.

## 2014-05-12 ENCOUNTER — Encounter (HOSPITAL_COMMUNITY)
Admission: RE | Admit: 2014-05-12 | Discharge: 2014-05-12 | Disposition: A | Discharge status: Home / Self Care | Payer: BC Managed Care – PPO | Source: Ambulatory Visit | Attending: Cardiovascular Disease | Admitting: Cardiovascular Disease

## 2014-05-12 DIAGNOSIS — Z5189 Encounter for other specified aftercare: Secondary | ICD-10-CM | POA: Diagnosis not present

## 2014-05-15 ENCOUNTER — Encounter (HOSPITAL_COMMUNITY): Payer: Self-pay | Admitting: Emergency Medicine

## 2014-05-15 ENCOUNTER — Emergency Department (HOSPITAL_COMMUNITY)
Admission: EM | Admit: 2014-05-15 | Discharge: 2014-05-15 | Disposition: A | Discharge status: Home / Self Care | Payer: Worker's Compensation | Attending: Emergency Medicine | Admitting: Emergency Medicine

## 2014-05-15 ENCOUNTER — Encounter (HOSPITAL_COMMUNITY)
Admission: RE | Admit: 2014-05-15 | Discharge: 2014-05-15 | Disposition: A | Discharge status: Home / Self Care | Payer: BC Managed Care – PPO | Source: Ambulatory Visit | Attending: Cardiovascular Disease | Admitting: Cardiovascular Disease

## 2014-05-15 ENCOUNTER — Emergency Department (HOSPITAL_COMMUNITY): Payer: Worker's Compensation

## 2014-05-15 DIAGNOSIS — M79609 Pain in unspecified limb: Secondary | ICD-10-CM

## 2014-05-15 DIAGNOSIS — I2510 Atherosclerotic heart disease of native coronary artery without angina pectoris: Secondary | ICD-10-CM | POA: Insufficient documentation

## 2014-05-15 DIAGNOSIS — X58XXXA Exposure to other specified factors, initial encounter: Secondary | ICD-10-CM | POA: Diagnosis not present

## 2014-05-15 DIAGNOSIS — Z7982 Long term (current) use of aspirin: Secondary | ICD-10-CM | POA: Insufficient documentation

## 2014-05-15 DIAGNOSIS — Y939 Activity, unspecified: Secondary | ICD-10-CM | POA: Diagnosis not present

## 2014-05-15 DIAGNOSIS — I252 Old myocardial infarction: Secondary | ICD-10-CM | POA: Diagnosis not present

## 2014-05-15 DIAGNOSIS — R079 Chest pain, unspecified: Secondary | ICD-10-CM | POA: Diagnosis present

## 2014-05-15 DIAGNOSIS — M79601 Pain in right arm: Secondary | ICD-10-CM

## 2014-05-15 DIAGNOSIS — Z951 Presence of aortocoronary bypass graft: Secondary | ICD-10-CM | POA: Diagnosis not present

## 2014-05-15 DIAGNOSIS — E785 Hyperlipidemia, unspecified: Secondary | ICD-10-CM | POA: Diagnosis not present

## 2014-05-15 DIAGNOSIS — Z79899 Other long term (current) drug therapy: Secondary | ICD-10-CM | POA: Diagnosis not present

## 2014-05-15 DIAGNOSIS — IMO0002 Reserved for concepts with insufficient information to code with codable children: Secondary | ICD-10-CM | POA: Insufficient documentation

## 2014-05-15 DIAGNOSIS — R0789 Other chest pain: Secondary | ICD-10-CM | POA: Insufficient documentation

## 2014-05-15 DIAGNOSIS — Z87891 Personal history of nicotine dependence: Secondary | ICD-10-CM | POA: Diagnosis not present

## 2014-05-15 DIAGNOSIS — S46911A Strain of unspecified muscle, fascia and tendon at shoulder and upper arm level, right arm, initial encounter: Secondary | ICD-10-CM

## 2014-05-15 DIAGNOSIS — Y929 Unspecified place or not applicable: Secondary | ICD-10-CM | POA: Diagnosis not present

## 2014-05-15 LAB — BASIC METABOLIC PANEL
Anion gap: 13 (ref 5–15)
BUN: 14 mg/dL (ref 6–23)
CALCIUM: 9.2 mg/dL (ref 8.4–10.5)
CO2: 26 mEq/L (ref 19–32)
Chloride: 102 mEq/L (ref 96–112)
Creatinine, Ser: 0.46 mg/dL — ABNORMAL LOW (ref 0.50–1.10)
GFR calc Af Amer: >90 mL/min (ref >90)
GLUCOSE: 87 mg/dL (ref 70–99)
Potassium: 4.4 mEq/L (ref 3.7–5.3)
Sodium: 141 mEq/L (ref 137–147)

## 2014-05-15 LAB — CBC
HEMATOCRIT: 38.1 % (ref 36.0–46.0)
HEMOGLOBIN: 12.9 g/dL (ref 12.0–15.0)
MCH: 29.5 pg (ref 26.0–34.0)
MCHC: 33.9 g/dL (ref 30.0–36.0)
MCV: 87.2 fL (ref 78.0–100.0)
Platelets: 388 10*3/uL (ref 150–400)
RBC: 4.37 MIL/uL (ref 3.87–5.11)
RDW: 13.8 % (ref 11.5–15.5)
WBC: 8.4 10*3/uL (ref 4.0–10.5)

## 2014-05-15 LAB — PRO B NATRIURETIC PEPTIDE: PRO B NATRI PEPTIDE: 727.6 pg/mL — AB (ref 0–125)

## 2014-05-15 LAB — TROPONIN I

## 2014-05-15 LAB — I-STAT TROPONIN, ED: TROPONIN I, POC: 0.01 ng/mL (ref 0.00–0.08)

## 2014-05-15 MED ORDER — CYCLOBENZAPRINE HCL 10 MG PO TABS: 10.0000 mg | ORAL_TABLET | Freq: Three times a day (TID) | ORAL | Status: DC | PRN

## 2014-05-15 NOTE — Consult Note (Signed)
Patient seen and examined and history reviewed. Agree with above findings and plan. 43 yo WF with recent anterior STEMI in July s/p DES of the LAD presents to the ED from cardiac Rehab for evaluation of right shoulder and chest pain. The patient also has a musculoskeletal injury to her right scapular area that she sustained at work and for which she has been taking Flexeril on a daily basis. She ran out of her Flexeril one week ago. Today she developed severe pain in her right shoulder and scapular area that came in a wave or spasm. It recurred on her way to Cardiac Rehab. It did radiate to her anterior chest but is different than her MI pain. Exam is benign. I cannot elicit any point tenderness. Cardiac and pulmonary exam are normal. Ecg is without acute change. Initial labs are normal except for elevated BNP. Her history is most consistent with acute muscle spasm. Will repeat troponin level. If normal will plan DC home with refill of her Flexeril. She has follow up in the office this week.  Gizel Riedlinger [place], MDFACC 05/15/2014 9:08 PM

## 2014-05-15 NOTE — ED Notes (Signed)
Lab at bedside

## 2014-05-15 NOTE — ED Provider Notes (Signed)
Medical screening examination/treatment/procedure(s) were conducted as a shared visit with non-physician practitioner(s) and myself.  I personally evaluated the patient during the encounter.   EKG Interpretation   Date/Time:  Monday May 15 2014 14:07:45 EDT Ventricular Rate:  69 PR Interval:  176 QRS Duration: 92 QT Interval:  424 QTC Calculation: 454 R Axis:   57 Text Interpretation:  Normal sinus rhythm Incomplete right bundle branch  block Anterior infarct , age undetermined Abnormal ECG Confirmed by  POLLINA  MD, CHRISTOPHER [phone]) on 05/15/2014 5:27:27 PM      Patient presents with complaints of chest pain. Patient had an episode of sharp pain on the right side of her chest which radiated to her jaw. It is cramping in her arm. She dictated nitroglycerin with improvement. She is episode prior to arrival to the ER, now pain-free. Patient does have a significant cardiac history including ST elevation MI 2 months ago. Initial workup was negative. Cardiology has seen the patient. Recommend a second troponin, discharge with followup in the office is negative.  Shannon Crease, MD 05/15/14 [phone]

## 2014-05-15 NOTE — ED Notes (Signed)
Dr. Pollina at bedside   

## 2014-05-15 NOTE — Consult Note (Signed)
Cardiology Consultation Note  Patient ID: GAVIN FAIVRE MRN: [id number], DOB: 1970/10/30 Date of Encounter: 05/15/2014, 5:53 PM Primary Physician: No PCP Per Patient Primary Cardiologist: Clifton James  Chief Complaint: CP Reason for Admission: CP  HPI: Ms. Dukes is a 43 y.o. female with recent anterior STEMI 02/2014 s/p DESx2 to LAD c/b acute stent thrombosis shortly after initial requiring repeat PTCA/thrombectomy, ICM EF 40-45%, prior tobacco abuse, dyslipidemia, prior subclavian bypass for stenosis who presents to [facility] ER with right shoulder pain radiating across her chest. She has done well since discharge and started cardiac rehab over a month ago. She did have soft BP so lisinopril was cut back. Otherwise this has gone well and she has not had any exertional chest pain or dyspnea. However, since a shoulder injury in June, she has had intermittent posterior right shoulder pain and spasms. (At that time, she had lifted a heavy box at work, felt a pop, and shoulder blade went numb followed by occasional pain.) She had been referred to orthopedics by urgent care but that appointment was originally scheduled the day after she had her MI so was moved. Flexeril was helping but she ran out about a week ago. She had another episode of right back shoulder pain on Thursday 9/17. She took a NTG, laid down, and had relief within a few minutes. The pain recurred again on the way to cardiac rehab while driving. This time it radiated across her right arm and right chest into the left chest which is further than the pain usually goes. Again she took a SL NTG and pain resolved within a few minutes. The cramping still comes and goes and she feels like there is a knot on her right shoulder blade. Nothing makes it worse or provokes the pain in particular. It doesn't tend to occur as often if she is lying down. No SOB, nausea, vomiting, palpitations, LEE, near syncope, syncope. Pain is not similar to the pain she had  with her MI. Initial troponin negative. pBNP 727 but no CHF symptoms. Not tachypnic, tachycardic or hypoxic. Telemetry stable.   Past Medical History  Diagnosis Date  . Tobacco abuse   . Subclavian artery disease     a. H/o subclavian artery stenosis s/p carotid/subclavian bypass.  Marland Kitchen NSVT (nonsustained ventricular tachycardia)   . CAD (coronary artery disease)     a. Anterior STEMI s/p PTCA/DESx2 to mLAD, residual moderate LAD disease - recurrent pain/ST elevation with emergent re-cath with acute stent thrombosis s/p PTCA, IVUS, thrombectomy of LAD.   . Ischemic cardiomyopathy     a. 02/2014: EF 40-45%, severe hypokinesis of mid-apical anteroseptal and apical myocardium, severe HK of mid-apical lateral myocardium.  Marland Kitchen Dyslipidemia      Most Recent Cardiac Studies: 2D Echo 03/14/14 - Left ventricle: The cavity size was normal. Wall thickness was increased in a pattern of mild LVH. Systolic function was mildly to moderately reduced. The estimated ejection fraction was in the range of 40% to 45%. There is severe hypokinesis of the mid-apicalanteroseptal and apical myocardium. There is severe hypokinesis of the mid-apicallateral myocardium. Doppler parameters are consistent with abnormal left ventricular relaxation (grade 1 diastolic dysfunction). Impressions: - LV shows good basilar LV function and severe hypokinesis of the mid and apical anteroseptal and lateral and apical segments suggestive of possible Takotsubo cardiomyopathy.  See cath x 2 from 02/2014   Surgical History:  Past Surgical History  Procedure Laterality Date  . Cholecystectomy    . Carotid artery -  subclavian artery bypass graft    . Abdominal hysterectomy       Home Meds: Prior to Admission medications   Medication Sig Start Date End Date Taking? Authorizing Provider  acetaminophen (TYLENOL) 500 MG tablet Take 1,000 mg by mouth every 6 (six) hours as needed for headache.    Yes Historical Provider, MD  aspirin  81 MG chewable tablet Chew 1 tablet (81 mg total) by mouth daily. 03/15/14  Yes Wilburt Finlay, PA-C  atorvastatin (LIPITOR) 80 MG tablet Take 1 tablet (80 mg total) by mouth daily at 6 PM. 03/15/14  Yes Wilburt Finlay, PA-C  lisinopril (PRINIVIL,ZESTRIL) 2.5 MG tablet Take 1 tablet (2.5 mg total) by mouth daily. 04/20/14  Yes Kathleene Hazel, MD  Melatonin 10 MG TABS Take 10 mg by mouth at bedtime as needed (for sleep).    Yes Historical Provider, MD  methocarbamol (ROBAXIN) 500 MG tablet Take 500 mg by mouth 3 (three) times daily.   Yes Historical Provider, MD  metoprolol succinate (TOPROL-XL) 50 MG 24 hr tablet Take 1 tablet (50 mg total) by mouth daily. Take with or immediately following a meal. 03/15/14  Yes Wilburt Finlay, PA-C  prasugrel (EFFIENT) 10 MG TABS tablet Take 10 mg by mouth daily.   Yes Historical Provider, MD    Allergies: No Known Allergies  History   Social History  . Marital Status: Married    Spouse Name: N/A    Number of Children: N/A  . Years of Education: N/A   Occupational History  . Not on file.   Social History Main Topics  . Smoking status: Former Smoker -- 1.00 packs/day for 25 years    Types: Cigarettes    Quit date: 03/13/2014  . Smokeless tobacco: Current User     Comment: vapor   . Alcohol Use: No  . Drug Use: No  . Sexual Activity: Yes    Birth Control/ Protection: None   Other Topics Concern  . Not on file   Social History Narrative  . No narrative on file     Family History  Problem Relation Age of Onset  . CAD Father     Review of Systems: All other systems reviewed and are otherwise negative except as noted above.  Labs:   Lab Results  Component Value Date   WBC 8.4 05/15/2014   HGB 12.9 05/15/2014   HCT 38.1 05/15/2014   MCV 87.2 05/15/2014   PLT 388 05/15/2014    Recent Labs Lab 05/15/14 1407  NA 141  K 4.4  CL 102  CO2 26  BUN 14  CREATININE 0.46*  CALCIUM 9.2  GLUCOSE 87    Lab Results  Component Value Date    CHOL 182 03/14/2014   HDL 33* 03/14/2014   LDLCALC 113* 03/14/2014   TRIG 180* 03/14/2014    Radiology/Studies:  Dg Chest 2 View  05/15/2014   CLINICAL DATA:  Four month history of right arm and shoulder pain radiating into the chest and right aspect of the neck beginning today ; history of shoulder injury in July 2015 ; history of myocardial infarction on March 13, 2014 with subsequent stent placement; history of hypertension and previous tobacco use until March 13, 2014.  EXAM: CHEST  2 VIEW  COMPARISON:  PA and lateral chest of November 15, 2008  FINDINGS: The lungs are borderline hyperinflated. The heart and pulmonary vascularity are normal. The mediastinum is normal in width. There is no pleural effusion. The observed portions of the  bony thorax exhibit no acute abnormalities. The shoulders are unremarkable where visualized.  IMPRESSION: There is no active cardiopulmonary disease. Mild hyperinflation may be voluntary or could reflect the patient's former chronic tobacco use.   Electronically Signed   By: David  [place]   On: 05/15/2014 16:11    EKG:  First: NSR 66bpm nonspecific ST-T changes particularly in precordial leads appear similar to prior outpatient tracing if not somewhat improved Second: NSR 69bpm less pronounced ST-T changes but there appears to be lead placement discrepancy between the two EKGs in precordial leads  Physical Exam: Blood pressure 92/57, pulse 63, temperature 98 F (36.7 C), temperature source Oral, resp. rate 12, SpO2 100.00%. General: Well developed, well nourished thin WF in no acute distress. Head: Normocephalic, atraumatic, sclera non-icteric, no xanthomas, nares are without discharge.  Neck:  JVD not elevated. Lungs: Clear bilaterally to auscultation without wheezes, rales, or rhonchi. Breathing is unlabored. Heart: RRR with S1 S2. No murmurs, rubs, or gallops appreciated. Abdomen: Soft, non-tender, non-distended with normoactive bowel sounds. No hepatomegaly. No  rebound/guarding. No obvious abdominal masses. Msk:  Strength and tone appear normal for age. Extremities: No clubbing or cyanosis. No edema.  Distal pedal pulses are 2+ and equal bilaterally. No focal point tenderness, shoulder deformity.  Neuro: Alert and oriented X 3. No focal deficit. No facial asymmetry. Moves all extremities spontaneously. Psych:  Responds to questions appropriately with a normal affect.    ASSESSMENT AND PLAN:  1. Right shoulder pain radiating into chest discomfort, atypical, sounds like muscle spasm 2. CAD with recent anterior STEMI s/p DESx2 to LAD c/b acute stent thrombosis s/p PTCA/thrombectomy  3. Ischemic cardiomyopathy EF 40-45% without CHF symptoms  Pain appears very atypical and focal, more suggestive of muscle spasm. No objective evidence of ischemia thus far. She was using Flexeril sporadically which was keeping symptoms at bay, but ran out last week. Robaxin isn't giving much relief. Will check another full troponin now. If negative, OK to d/c home, restart Flexeril, discontinue Robaxin. We have sent in an rx for Flexeril. Otherwise continue home meds and keep f/u as planned on 05/17/14.  Signed, Ronie Spies PA-C 05/15/2014, 5:53 PM

## 2014-05-15 NOTE — ED Provider Notes (Signed)
CSN: [id number]     Arrival date & time 05/15/14  1354 History   First MD Initiated Contact with Patient 05/15/14 1548     Chief Complaint  Patient presents with  . Chest Pain    HPI Shannon [name] is a 43 y.o. female with PMH of MI in July, stent placement, dyslipidemia, subclavian artery disease, NSVT presenting with chest squeezing. This pain started at 9:30 AM at cardiac rehab walking in the parking lot. Pain radiated into right arm cramping and radiated to central chest pain the chest pain became squeezing and radiated to her right arm and jaw. She noted mild SOB, diaphoresis and weakness that rapidly resolved. Patient took nitroglycerin which resolved the pain. Patient was at rest. Patient had second episode at 1300 of the same pain which was relieved by nitro. Patient asymptomatic in ED. Patient with 30 year smoking history but currently not smoking. Patient with treated HTN with lisinopril and metoprolol. Patient is seen by cardiology with appointment in two days. Cardiology Dr. Clifton James.   Past Medical History  Diagnosis Date  . Tobacco abuse   . Subclavian artery disease     a. H/o subclavian artery stenosis s/p carotid/subclavian bypass.  Marland Kitchen NSVT (nonsustained ventricular tachycardia)   . CAD (coronary artery disease)     a. Anterior STEMI 02/2014 s/p PTCA/DESx2 to mLAD, residual moderate LAD disease - recurrent pain/ST elevation with emergent re-cath with acute stent thrombosis s/p PTCA, IVUS, thrombectomy of LAD.   . Ischemic cardiomyopathy     a. 02/2014: EF 40-45%, severe hypokinesis of mid-apical anteroseptal and apical myocardium, severe HK of mid-apical lateral myocardium.  Marland Kitchen Dyslipidemia    Past Surgical History  Procedure Laterality Date  . Cholecystectomy    . Carotid artery - subclavian artery bypass graft    . Abdominal hysterectomy     Family History  Problem Relation Age of Onset  . CAD Father    History  Substance Use Topics  . Smoking status: Former Smoker  -- 1.00 packs/day for 25 years    Types: Cigarettes    Quit date: 03/13/2014  . Smokeless tobacco: Current User     Comment: vapor   . Alcohol Use: No   OB History   Grav Para Term Preterm Abortions TAB SAB Ect Mult Living                 Review of Systems  Constitutional: Negative for fever and chills.  HENT: Negative for congestion and rhinorrhea.   Eyes: Negative for visual disturbance.  Respiratory: Negative for cough and shortness of breath.   Cardiovascular: Positive for chest pain. Negative for palpitations.  Gastrointestinal: Negative for nausea, vomiting and diarrhea.  Genitourinary: Negative for dysuria and hematuria.  Musculoskeletal: Negative for back pain and gait problem.  Skin: Negative for rash.  Neurological: Negative for weakness and headaches.      Allergies  Review of patient's allergies indicates no known allergies.  Home Medications   Prior to Admission medications   Medication Sig Start Date End Date Taking? Authorizing Provider  acetaminophen (TYLENOL) 500 MG tablet Take 1,000 mg by mouth every 6 (six) hours as needed for headache.    Yes Historical Provider, MD  aspirin 81 MG chewable tablet Chew 1 tablet (81 mg total) by mouth daily. 03/15/14  Yes Wilburt Finlay, PA-C  atorvastatin (LIPITOR) 80 MG tablet Take 1 tablet (80 mg total) by mouth daily at 6 PM. 03/15/14  Yes Wilburt Finlay, PA-C  lisinopril (PRINIVIL,ZESTRIL) 2.5  MG tablet Take 1 tablet (2.5 mg total) by mouth daily. 04/20/14  Yes Kathleene Hazel, MD  Melatonin 10 MG TABS Take 10 mg by mouth at bedtime as needed (for sleep).    Yes Historical Provider, MD  methocarbamol (ROBAXIN) 500 MG tablet Take 500 mg by mouth 3 (three) times daily.   Yes Historical Provider, MD  metoprolol succinate (TOPROL-XL) 50 MG 24 hr tablet Take 1 tablet (50 mg total) by mouth daily. Take with or immediately following a meal. 03/15/14  Yes Wilburt Finlay, PA-C  prasugrel (EFFIENT) 10 MG TABS tablet Take 10 mg by mouth  daily.   Yes Historical Provider, MD  cyclobenzaprine (FLEXERIL) 10 MG tablet Take 1 tablet (10 mg total) by mouth 3 (three) times daily as needed for muscle spasms. 05/15/14   Dayna N Dunn, PA-C   BP 92/57  Pulse 63  Temp(Src) 98 F (36.7 C) (Oral)  Resp 12  SpO2 100% Physical Exam  Nursing note and vitals reviewed. Constitutional: She appears well-developed and well-nourished. No distress.  HENT:  Head: Normocephalic and atraumatic.  Eyes: Conjunctivae and EOM are normal. Right eye exhibits no discharge. Left eye exhibits no discharge.  Cardiovascular: Normal rate, regular rhythm, normal heart sounds and intact distal pulses.   Chest pain not reproducible.  Pulmonary/Chest: Effort normal and breath sounds normal. No respiratory distress. She has no wheezes.  Abdominal: Soft. Bowel sounds are normal. She exhibits no distension. There is no tenderness.  Musculoskeletal:  Right shoulder tenderness to lateral scapula with muscle tightness. FROM. Normal strength and sensation. Neurovascularly intact.  Neurological: She is alert. She exhibits normal muscle tone. Coordination normal.  Skin: Skin is warm and dry. She is not diaphoretic.    ED Course  Procedures (including critical care time) Labs Review Labs Reviewed  BASIC METABOLIC PANEL - Abnormal; Notable for the following:    Creatinine, Ser 0.46 (*)    All other components within normal limits  PRO B NATRIURETIC PEPTIDE - Abnormal; Notable for the following:    Pro B Natriuretic peptide (BNP) 727.6 (*)    All other components within normal limits  CBC  TROPONIN I  I-STAT TROPOININ, ED    Imaging Review Dg Chest 2 View  05/15/2014   CLINICAL DATA:  Four month history of right arm and shoulder pain radiating into the chest and right aspect of the neck beginning today ; history of shoulder injury in July 2015 ; history of myocardial infarction on March 13, 2014 with subsequent stent placement; history of hypertension and previous  tobacco use until March 13, 2014.  EXAM: CHEST  2 VIEW  COMPARISON:  PA and lateral chest of November 15, 2008  FINDINGS: The lungs are borderline hyperinflated. The heart and pulmonary vascularity are normal. The mediastinum is normal in width. There is no pleural effusion. The observed portions of the bony thorax exhibit no acute abnormalities. The shoulders are unremarkable where visualized.  IMPRESSION: There is no active cardiopulmonary disease. Mild hyperinflation may be voluntary or could reflect the patient's former chronic tobacco use.   Electronically Signed   By: David  [place]   On: 05/15/2014 16:11     EKG Interpretation   Date/Time:  Monday May 15 2014 14:07:45 EDT Ventricular Rate:  69 PR Interval:  176 QRS Duration: 92 QT Interval:  424 QTC Calculation: 454 R Axis:   57 Text Interpretation:  Normal sinus rhythm Incomplete right bundle branch  block Anterior infarct , age undetermined Abnormal ECG Confirmed by  POLLINA  MD, Cristal Deer [phone]) on 05/15/2014 5:27:27 PM     Meds given in ED:  Medications - No data to display  Current Discharge Medication List    START taking these medications   Details  cyclobenzaprine (FLEXERIL) 10 MG tablet Take 1 tablet (10 mg total) by mouth 3 (three) times daily as needed for muscle spasms. Qty: 30 tablet, Refills: 0          MDM   Final diagnoses:  Muscle strain of right shoulder region, initial encounter  Squeezing chest pain  H/O myocardial infarction, greater than 8 weeks   Patient with history of MI in July with two episodes of chest squeezing pain in mid chest at rest and with walking. Episodes today relieved with nitro. VSS. CXR without acute changes. Troponin negative. EKG with incomplete right BBB and signs of old anterior infarct. Cardiology consulted and recommended additional troponin and if negative, discharge. Patient has appointment in two days with cardiology.  Patient also with right shoulder pain.  Neurovascularly intact. Patient with muscle tightness. Will provide script for flexeril. Sedation precautions provided.   Patient signed out to Dr. Blinda Leatherwood at shift change.      Louann Sjogren, PA-C 05/15/14 1826

## 2014-05-15 NOTE — Progress Notes (Signed)
Cheyna reports having chest pain in the parking lot prior to coming to exercise at cardiac rehab. Marchele describes the pain as sharp it started in her right shoulder blade and radiated to her chest and jaw. Rishita says the pain was a squeezing sensation around her heart. Brittani said she took a sublingual nitroglycerin with relief. Jearlene denies having any chest pain upon entry into cardiac rehab.  Patient placed on the Zoll. Telemetry rhythm Sinus blood pressure 108/70. Janki said she had some chest discomfort this morning and took a sublingual nitroglycerin with relief.   Nada Boozer FNP-C called and notified.  Vernona Rieger advised that the patient be taken to the ED for further evaluation. 12 lead ECG obtained. Patient taken to the ED via stretcher on 2l/mi of oxygen. Patient contacted her husband to let him know about today's events. Report given to ED RN.  No exercise today.

## 2014-05-15 NOTE — ED Notes (Signed)
Pt from cardiac rehab. Pt states this 0930 AM she had right arm pain that radiated to central chest, right arm and to jaw, desrcribed as "squeezing" with mild SOB, diaphoresis and weakness. Pt states she took 1 nitro which relieved pain. Pt had another episode at 1300 which was again relieved with nitro. Pt denies pain at this time. Hx of MI in July.

## 2014-05-15 NOTE — ED Notes (Addendum)
Pt. Reports pain starting in right shoulder that radiated to chest, jaw, and arm. States has a pinched nerve to the right shoulder. Pt. States "I got really nervous because it radiated to my left chest and it normally doesn't go that far". Reports mild SOB and diaphoresis with it. Denies pain at this time. Hx of MI and stent placement

## 2014-05-17 ENCOUNTER — Ambulatory Visit (INDEPENDENT_AMBULATORY_CARE_PROVIDER_SITE_OTHER): Payer: BC Managed Care – PPO | Admitting: Physician Assistant

## 2014-05-17 ENCOUNTER — Other Ambulatory Visit: Payer: BC Managed Care – PPO

## 2014-05-17 ENCOUNTER — Encounter: Payer: Self-pay | Admitting: *Deleted

## 2014-05-17 ENCOUNTER — Encounter (HOSPITAL_COMMUNITY): Admission: RE | Admit: 2014-05-17 | Payer: BC Managed Care – PPO | Source: Ambulatory Visit

## 2014-05-17 ENCOUNTER — Encounter: Payer: Self-pay | Admitting: Physician Assistant

## 2014-05-17 VITALS — BP 92/70 | HR 76 | Ht 63.0 in | Wt 139.0 lb

## 2014-05-17 DIAGNOSIS — I2589 Other forms of chronic ischemic heart disease: Secondary | ICD-10-CM

## 2014-05-17 DIAGNOSIS — I209 Angina pectoris, unspecified: Secondary | ICD-10-CM

## 2014-05-17 DIAGNOSIS — I255 Ischemic cardiomyopathy: Secondary | ICD-10-CM

## 2014-05-17 DIAGNOSIS — I2510 Atherosclerotic heart disease of native coronary artery without angina pectoris: Secondary | ICD-10-CM

## 2014-05-17 DIAGNOSIS — E785 Hyperlipidemia, unspecified: Secondary | ICD-10-CM

## 2014-05-17 DIAGNOSIS — I739 Peripheral vascular disease, unspecified: Secondary | ICD-10-CM

## 2014-05-17 DIAGNOSIS — I779 Disorder of arteries and arterioles, unspecified: Secondary | ICD-10-CM

## 2014-05-17 DIAGNOSIS — I25119 Atherosclerotic heart disease of native coronary artery with unspecified angina pectoris: Secondary | ICD-10-CM

## 2014-05-17 LAB — COMPREHENSIVE METABOLIC PANEL
ALT: 18 U/L (ref 0–35)
AST: 15 U/L (ref 0–37)
Albumin: 3.9 g/dL (ref 3.5–5.2)
Alkaline Phosphatase: 122 U/L — ABNORMAL HIGH (ref 39–117)
BUN: 13 mg/dL (ref 6–23)
CO2: 27 meq/L (ref 19–32)
Calcium: 9.1 mg/dL (ref 8.4–10.5)
Chloride: 104 mEq/L (ref 96–112)
Creatinine, Ser: 0.5 mg/dL (ref 0.4–1.2)
GFR: 133.23 mL/min (ref >60.00)
Glucose, Bld: 97 mg/dL (ref 70–99)
POTASSIUM: 4.4 meq/L (ref 3.5–5.1)
SODIUM: 137 meq/L (ref 135–145)
TOTAL PROTEIN: 7.5 g/dL (ref 6.0–8.3)
Total Bilirubin: 0.3 mg/dL (ref 0.2–1.2)

## 2014-05-17 LAB — LIPID PANEL
CHOLESTEROL: 139 mg/dL (ref 0–200)
HDL: 37.8 mg/dL — ABNORMAL LOW (ref >39.00)
LDL Cholesterol: 82 mg/dL (ref 0–99)
NonHDL: 101.2
Total CHOL/HDL Ratio: 4
Triglycerides: 98 mg/dL (ref 0.0–149.0)
VLDL: 19.6 mg/dL (ref 0.0–40.0)

## 2014-05-17 NOTE — Progress Notes (Signed)
[address] [place], [place]  [address] Phone: [phone] Fax:  [phone]  Date:  05/17/2014   Patient ID:  Shannon [name], Shannon [name] 1970-11-27, MRN [id number]   PCP:  No PCP Per Patient  Cardiologist:  Clifton James   History of Present Illness: Shannon [name] is a 43 y.o. female with history of anterior STEMI 02/2014 s/p DESx2 to LAD c/b acute stent thrombosis shortly after requiring repeat PTCA/thrombectomy, ICM EF 40-45%, prior tobacco abuse, dyslipidemia, prior subclavian bypass for stenosis. She presents today for planned outpatient follow-up, but I also saw her in the ER two nights ago for posterior shoulder pain.  She has done well since her MI and started cardiac rehab over a month ago. She did have soft BP so lisinopril was cut back. Otherwise this has gone well and she has not had any exertional chest pain or dyspnea. However, since a prior shoulder injury in June, she has had intermittent posterior right shoulder pain and spasms. (At that time, she had lifted a heavy box at work, felt a pop, and shoulder blade went numb followed by occasional pain.) She had been referred to orthopedics by urgent care but that appointment was originally scheduled for the day after she had her MI and never got rescheduled. Flexeril was helping but she ran out about a week ago. She came to the ER 05/15/14 due to recurrence of posterior right shoulder "spasm" extending from her right arm and right chest into her left chest while driving to cardiac rehab. Pain was not similar to her prior MI, not made worse by anything, not provoked by anything in particular, and without any associated symptoms. It doesn't tend to occur as often if she is lying down. Troponins were negative x 2 and EKG was felt to be stable. pBNP was 727 but she had no clinical evidence of CHF. In fact, she has not manifested any clinical signs of CHF since her MI. She was discharged from the ED with a refill of Flexeril.  She  continues to have intermittent posterior right shoulder blade pain and feels like there is a knot in this area. She plans to pursue her referral to [facility]. No further CP. No dyspnea, palpitations, bleeding, near syncope, or syncope. She does feel lightheaded sometimes after standing for several hours at work. However, despite SBP typically running in the 90s (as it was in the ED the other day), she feels fine today.  Recent Labs: 05/15/2014: Hemoglobin 12.9; Pro B Natriuretic peptide (BNP) 727.6*  05/17/2014: ALT 18; Creatinine 0.5; HDL Cholesterol by NMR 37.80*; LDL (calc) 82; Potassium 4.4   Wt Readings from Last 3 Encounters:  05/17/14 139 lb (63.05 kg)  04/06/14 135 lb 9.3 oz (61.5 kg)  03/31/14 134 lb 12.8 oz (61.145 kg)     Past Medical History  Diagnosis Date  . Tobacco abuse   . Subclavian artery disease     a. H/o subclavian artery stenosis s/p carotid/subclavian bypass.  Marland Kitchen NSVT (nonsustained ventricular tachycardia)   . CAD (coronary artery disease)     a. Anterior STEMI 02/2014 s/p PTCA/DESx2 to mLAD, residual moderate LAD disease - recurrent pain/ST elevation with emergent re-cath with acute stent thrombosis s/p PTCA, IVUS, thrombectomy of LAD.   . Ischemic cardiomyopathy     a. 02/2014: EF 40-45%, severe hypokinesis of mid-apical anteroseptal and apical myocardium, severe HK of mid-apical lateral myocardium.  Marland Kitchen Dyslipidemia     Current Outpatient Prescriptions  Medication Sig  Dispense Refill  . acetaminophen (TYLENOL) 500 MG tablet Take 1,000 mg by mouth every 6 (six) hours as needed for headache.       Marland Kitchen aspirin 81 MG chewable tablet Chew 1 tablet (81 mg total) by mouth daily.      Marland Kitchen atorvastatin (LIPITOR) 80 MG tablet Take 1 tablet (80 mg total) by mouth daily at 6 PM.  30 tablet  5  . cyclobenzaprine (FLEXERIL) 10 MG tablet Take 1 tablet (10 mg total) by mouth 3 (three) times daily as needed for muscle spasms.  30 tablet  0  . lisinopril (PRINIVIL,ZESTRIL)  2.5 MG tablet Take 1 tablet (2.5 mg total) by mouth daily.  30 tablet  5  . Melatonin 10 MG TABS Take 10 mg by mouth at bedtime as needed (for sleep).       . metoprolol succinate (TOPROL-XL) 50 MG 24 hr tablet Take 1 tablet (50 mg total) by mouth daily. Take with or immediately following a meal.  30 tablet  5  . prasugrel (EFFIENT) 10 MG TABS tablet Take 10 mg by mouth daily.       No current facility-administered medications for this visit.    Allergies:   Review of patient's allergies indicates no known allergies.   Social History:  The patient  reports that she quit smoking about 2 months ago. Her smoking use included Cigarettes. She has a 25 pack-year smoking history. She uses smokeless tobacco. She reports that she does not drink alcohol or use illicit drugs.   Family History:  The patient's family history includes CAD in her father.   ROS:  Please see the history of present illness.    All other systems reviewed and negative.   PHYSICAL EXAM:  VS:  BP 92/70  Pulse 76  Ht  (1.6 m)  Wt 139 lb (63.05 kg)  BMI 24.63 kg/m2 Well nourished, well developed WF in no acute distress HEENT: normal Neck: no JVD Cardiac:  normal S1, S2; RRR; no murmur Lungs:  clear to auscultation bilaterally, no wheezing, rhonchi or rales Abd: soft, nontender, no hepatomegaly Ext: no edema Skin: warm and dry Neuro:  moves all extremities spontaneously, no focal abnormalities noted  ASSESSMENT AND PLAN:  1. CAD with anterior STEMI 02/2014 s/p DESx2 to LAD complicated by acute stent thrombosis s/p PTCA/thrombectomy - doing well. Continue aspirin, statin, beta blocker, Effient. Due for f/u CMET, lipids today. She will ultimately f/u with Dr. Clifton James to determine duration of Effient. Recent right shoulder pain is likely muscle spasm related to her work injury (see consult note from ED 9/21 for full thoughts). She plans to f/u with [facility] She will return to cardiac rehab on Friday 9/25  and if that is uneventful, she may return to work as planned on Saturday. She asked about getting a tattoo. I talked to Dr. Eldridge Dace about this and ultimately we decided it's probably not a good idea while on aspirin and Effient - I've asked her to hold off. 2. Ischemic cardiomyopathy EF 40-45% without CHF symptoms - continue lisinopril and beta blocker. I told her if the lightheadedness upon standing for long periods of time becomes bothersome, she should discontinue her lisinopril as this may be related to her soft BP. However, she remains asymptomatic today so we will continue current regimen. I also asked her to consider finding ways to sit down at work versus making sure to move around to avoid this issue. Will reassess LV function echo in 1 month.  3. Dyslipidemia - f/u labs pending today. 4. Subclavian artery disease - no significant symptoms.  Dispo: F/u Dr. Clifton James 2-3 months.  Signed, Ronie Spies, PA-C  05/17/2014 4:37 PM

## 2014-05-17 NOTE — Patient Instructions (Signed)
Your physician recommends that you continue on your current medications as directed. Please refer to the Current Medication list given to you today.    Your physician has requested that you have an echocardiogram. Echocardiography is a painless test that uses sound waves to create images of your heart. It provides your doctor with information about the size and shape of your heart and how well your heart's chambers and valves are working. This procedure takes approximately one hour. There are no restrictions for this procedure.    Your physician recommends that you schedule a follow-up appointment in:  WITH [facility] IN 2 TO 3 MONTHS

## 2014-05-18 ENCOUNTER — Encounter (HOSPITAL_COMMUNITY): Payer: Self-pay | Admitting: Emergency Medicine

## 2014-05-18 ENCOUNTER — Emergency Department (HOSPITAL_COMMUNITY): Payer: Worker's Compensation

## 2014-05-18 ENCOUNTER — Telehealth: Payer: Self-pay | Admitting: *Deleted

## 2014-05-18 ENCOUNTER — Emergency Department (HOSPITAL_COMMUNITY)
Admission: EM | Admit: 2014-05-18 | Discharge: 2014-05-18 | Disposition: A | Discharge status: Home / Self Care | Payer: Worker's Compensation | Attending: Emergency Medicine | Admitting: Emergency Medicine

## 2014-05-18 DIAGNOSIS — Z87891 Personal history of nicotine dependence: Secondary | ICD-10-CM | POA: Insufficient documentation

## 2014-05-18 DIAGNOSIS — Z79899 Other long term (current) drug therapy: Secondary | ICD-10-CM | POA: Insufficient documentation

## 2014-05-18 DIAGNOSIS — I2510 Atherosclerotic heart disease of native coronary artery without angina pectoris: Secondary | ICD-10-CM | POA: Insufficient documentation

## 2014-05-18 DIAGNOSIS — Z951 Presence of aortocoronary bypass graft: Secondary | ICD-10-CM | POA: Insufficient documentation

## 2014-05-18 DIAGNOSIS — Z7982 Long term (current) use of aspirin: Secondary | ICD-10-CM | POA: Diagnosis not present

## 2014-05-18 DIAGNOSIS — R079 Chest pain, unspecified: Secondary | ICD-10-CM | POA: Diagnosis not present

## 2014-05-18 DIAGNOSIS — M25519 Pain in unspecified shoulder: Secondary | ICD-10-CM | POA: Insufficient documentation

## 2014-05-18 DIAGNOSIS — M25511 Pain in right shoulder: Secondary | ICD-10-CM

## 2014-05-18 LAB — CBC
HCT: 41.8 % (ref 36.0–46.0)
Hemoglobin: 13.6 g/dL (ref 12.0–15.0)
MCH: 28.9 pg (ref 26.0–34.0)
MCHC: 32.5 g/dL (ref 30.0–36.0)
MCV: 88.7 fL (ref 78.0–100.0)
PLATELETS: 335 10*3/uL (ref 150–400)
RBC: 4.71 MIL/uL (ref 3.87–5.11)
RDW: 13.8 % (ref 11.5–15.5)
WBC: 7.7 10*3/uL (ref 4.0–10.5)

## 2014-05-18 LAB — BASIC METABOLIC PANEL
Anion gap: 13 (ref 5–15)
BUN: 13 mg/dL (ref 6–23)
CALCIUM: 9.7 mg/dL (ref 8.4–10.5)
CO2: 25 mEq/L (ref 19–32)
Chloride: 102 mEq/L (ref 96–112)
Creatinine, Ser: 0.47 mg/dL — ABNORMAL LOW (ref 0.50–1.10)
Glucose, Bld: 92 mg/dL (ref 70–99)
Potassium: 4.3 mEq/L (ref 3.7–5.3)
SODIUM: 140 meq/L (ref 137–147)

## 2014-05-18 LAB — I-STAT TROPONIN, ED: TROPONIN I, POC: 0.01 ng/mL (ref 0.00–0.08)

## 2014-05-18 MED ORDER — DIAZEPAM 5 MG PO TABS: 5.0000 mg | ORAL_TABLET | Freq: Four times a day (QID) | ORAL | Status: DC | PRN

## 2014-05-18 MED ORDER — OXYCODONE-ACETAMINOPHEN 5-325 MG PO TABS: 1.0000 | ORAL_TABLET | ORAL | Status: DC | PRN

## 2014-05-18 MED ORDER — ONDANSETRON 4 MG PO TBDP: 4.0000 mg | ORAL_TABLET | Freq: Three times a day (TID) | ORAL | Status: DC | PRN

## 2014-05-18 NOTE — Telephone Encounter (Signed)
lmom labs ok. Per Ronie Spies, PA recommends kep working on diet and exercise. If any questions cb [phone].

## 2014-05-18 NOTE — ED Provider Notes (Signed)
I saw and evaluated the patient, reviewed the resident's note and I agree with the findings and plan.   EKG Interpretation   Date/Time:  Thursday May 18 2014 14:29:16 EDT Ventricular Rate:  78 PR Interval:  172 QRS Duration: 90 QT Interval:  404 QTC Calculation: 460 R Axis:   47 Text Interpretation:  Normal sinus rhythm Nonspecific T wave abnormality  No significant change since last tracing Confirmed by YAO  MD, DAVID  ([id number]) on 05/18/2014 7:40:19 PM      Shannon [name] is a 43 y.o. female hx of CAD s/p subclavian artery bypass graft here with R shoulder and back pain. Has been going on for the last week. Seen in ED several days ago and was thought to have muscle spasms. Saw cardiology yesterday and didn't think it was cardiac. Pain improved with nitro so she was concerned. Anxious on exam, heart, lung, abdomen unremarkable. Muscle spasm on R shoulder area that is reproducible. Will switch to percocet, valium from flexeril. Has f/u.    Richardean Canal, MD 05/18/14 2019

## 2014-05-18 NOTE — Discharge Instructions (Signed)
Shoulder Pain The shoulder is the joint that connects your arm to your body. Muscles and band-like tissues that connect bones to muscles (tendons) hold the joint together. Shoulder pain is felt if an injury or medical problem affects one or more parts of the shoulder. HOME CARE   Put ice on the sore area.  Put ice in a plastic bag.  Place a towel between your skin and the bag.  Leave the ice on for 15-20 minutes, 03-04 times a day for the first 2 days.  Stop using cold packs if they do not help with the pain.  If you were given something to keep your shoulder from moving (sling; shoulder immobilizer), wear it as told. Only take it off to shower or bathe.  Move your arm as little as possible, but keep your hand moving to prevent puffiness (swelling).  Squeeze a soft ball or foam pad as much as possible to help prevent swelling.  Take medicine as told by your doctor. GET HELP IF:  You have progressing new pain in your arm, hand, or fingers.  Your hand or fingers get cold.  Your medicine does not help lessen your pain. GET HELP RIGHT AWAY IF:   Your arm, hand, or fingers are numb or tingling.  Your arm, hand, or fingers are puffy (swollen), painful, or turn white or blue. MAKE SURE YOU:   Understand these instructions.  Will watch your condition.  Will get help right away if you are not doing well or get worse. Document Released: 01/28/2008 Document Revised: 12/26/2013 Document Reviewed: 02/23/2012 [facility] Patient Information 2015 [place], [place]. This information is not intended to replace advice given to you by your health care provider. Make sure you discuss any questions you have with your health care provider.   Arthralgia Arthralgia is joint pain. A joint is a place where two bones meet. Joint pain can happen for many reasons. The joint can be bruised, stiff, infected, or weak from aging. Pain usually goes away after resting and taking medicine for soreness.  HOME  CARE  Rest the joint as told by your doctor.  Keep the sore joint raised (elevated) for the first 24 hours.  Put ice on the joint area.  Put ice in a plastic bag.  Place a towel between your skin and the bag.  Leave the ice on for 15-20 minutes, 03-04 times a day.  Wear your splint, casting, elastic bandage, or sling as told by your doctor.  Only take medicine as told by your doctor. Do not take aspirin.  Use crutches as told by your doctor. Do not put weight on the joint until told to by your doctor. GET HELP RIGHT AWAY IF:   You have bruising, puffiness (swelling), or more pain.  Your fingers or toes turn blue or start to lose feeling (numb).  Your medicine does not lessen the pain.  Your pain becomes severe.  You have a temperature by mouth above 102 F (38.9 C), not controlled by medicine.  You cannot move or use the joint. MAKE SURE YOU:   Understand these instructions.  Will watch your condition.  Will get help right away if you are not doing well or get worse. Document Released: 07/30/2009 Document Revised: 11/03/2011 Document Reviewed: 07/30/2009 [facility] Patient Information 2015 [place], [place]. This information is not intended to replace advice given to you by your health care provider. Make sure you discuss any questions you have with your health care provider.

## 2014-05-18 NOTE — ED Provider Notes (Signed)
CSN: [id number]     Arrival date & time 05/18/14  1424 History   First MD Initiated Contact with Patient 05/18/14 1923     Chief Complaint  Patient presents with  . Shoulder Pain  . Chest Pain     (Consider location/radiation/quality/duration/timing/severity/associated sxs/prior Treatment) HPI Comments: Pt is a 43 y.o w/ PMHx of HTN, CAD, subclavian artery dz, hx of MI w/ cc: of R shoulder pain. Pt states she has had for 1 week and states hurts in R scapula and radiates down R arm. States worse with movement. She has a hx of an old injury for [employer] and that this is the appropriate shoulder. Denies chest pain, abd pain, n/v/d, SOB.  States does not feel like previous MI. She is concerned that she has had a MI in past and took NTG for this pain which helps.   She has been seen in this ED three days ago and delta trop'd sent home, as well as with Cardiology yesterday who does not feel this is related to her heart and believes it is MSK related.  Patient is a 43 y.o. female presenting with shoulder pain and chest pain. The history is provided by the patient.  Shoulder Pain This is a recurrent problem. The current episode started in the past 7 days. The problem occurs intermittently. The problem has been waxing and waning. Pertinent negatives include no abdominal pain, chest pain, chills, congestion, coughing, fatigue, fever, nausea, numbness, rash, vomiting or weakness. Exacerbated by: certain positions and moving R arm. Treatments tried: NTG. The treatment provided significant relief.  Chest Pain Associated symptoms: no abdominal pain, no back pain, no cough, no fatigue, no fever, no nausea, no numbness, no shortness of breath, not vomiting and no weakness     Past Medical History  Diagnosis Date  . Tobacco abuse   . Subclavian artery disease     a. H/o subclavian artery stenosis s/p carotid/subclavian bypass.  Marland Kitchen NSVT (nonsustained ventricular tachycardia)   . CAD (coronary artery  disease)     a. Anterior STEMI 02/2014 s/p PTCA/DESx2 to mLAD, residual moderate LAD disease - recurrent pain/ST elevation with emergent re-cath with acute stent thrombosis s/p PTCA, IVUS, thrombectomy of LAD.   . Ischemic cardiomyopathy     a. 02/2014: EF 40-45%, severe hypokinesis of mid-apical anteroseptal and apical myocardium, severe HK of mid-apical lateral myocardium.  Marland Kitchen Dyslipidemia    Past Surgical History  Procedure Laterality Date  . Cholecystectomy    . Carotid artery - subclavian artery bypass graft    . Abdominal hysterectomy     Family History  Problem Relation Age of Onset  . CAD Father    History  Substance Use Topics  . Smoking status: Former Smoker -- 1.00 packs/day for 25 years    Types: Cigarettes    Quit date: 03/13/2014  . Smokeless tobacco: Current User     Comment: vapor   . Alcohol Use: No   OB History   Grav Para Term Preterm Abortions TAB SAB Ect Mult Living                 Review of Systems  Constitutional: Negative for fever, chills, activity change, appetite change and fatigue.  HENT: Negative for congestion and rhinorrhea.   Eyes: Negative for discharge, redness and itching.  Respiratory: Negative for cough, shortness of breath and wheezing.   Cardiovascular: Negative for chest pain.  Gastrointestinal: Negative for nausea, vomiting, abdominal pain and diarrhea.  Genitourinary: Negative for  dysuria and hematuria.  Musculoskeletal: Negative for back pain.  Skin: Negative for rash and wound.  Neurological: Negative for syncope, weakness and numbness.      Allergies  Review of patient's allergies indicates no known allergies.  Home Medications   Prior to Admission medications   Medication Sig Start Date End Date Taking? Authorizing Provider  acetaminophen (TYLENOL) 500 MG tablet Take 1,000 mg by mouth every 6 (six) hours as needed for headache.    Yes Historical Provider, MD  aspirin 81 MG chewable tablet Chew 1 tablet (81 mg total) by  mouth daily. 03/15/14  Yes Dwana Melena, PA-C  atorvastatin (LIPITOR) 80 MG tablet Take 1 tablet (80 mg total) by mouth daily at 6 PM. 03/15/14  Yes Dwana Melena, PA-C  cyclobenzaprine (FLEXERIL) 10 MG tablet Take 1 tablet (10 mg total) by mouth 3 (three) times daily as needed for muscle spasms. 05/15/14  Yes Dayna N Dunn, PA-C  lisinopril (PRINIVIL,ZESTRIL) 2.5 MG tablet Take 1 tablet (2.5 mg total) by mouth daily. 04/20/14  Yes Kathleene Hazel, MD  Melatonin 10 MG TABS Take 10 mg by mouth at bedtime as needed (for sleep).    Yes Historical Provider, MD  metoprolol succinate (TOPROL-XL) 50 MG 24 hr tablet Take 1 tablet (50 mg total) by mouth daily. Take with or immediately following a meal. 03/15/14  Yes Dwana Melena, PA-C  prasugrel (EFFIENT) 10 MG TABS tablet Take 10 mg by mouth daily.   Yes Historical Provider, MD  diazepam (VALIUM) 5 MG tablet Take 1 tablet (5 mg total) by mouth every 6 (six) hours as needed for anxiety. 05/18/14   Pilar Jarvis, MD  oxyCODONE-acetaminophen (PERCOCET/ROXICET) 5-325 MG per tablet Take 1 tablet by mouth every 4 (four) hours as needed for severe pain. 05/18/14   Pilar Jarvis, MD   BP 96/63  Pulse 73  Temp(Src) 98.2 F (36.8 C) (Oral)  Resp 20  Ht  (1.6 m)  Wt 138 lb (62.596 kg)  BMI 24.45 kg/m2  SpO2 99% Physical Exam  Constitutional: She is oriented to person, place, and time. She appears well-developed and well-nourished. No distress.  HENT:  Head: Normocephalic and atraumatic.  Mouth/Throat: Oropharynx is clear and moist. No oropharyngeal exudate.  Eyes: Conjunctivae and EOM are normal. Pupils are equal, round, and reactive to light. Right eye exhibits no discharge. Left eye exhibits no discharge. No scleral icterus.  Neck: Normal range of motion. Neck supple.  Cardiovascular: Normal rate, regular rhythm and normal heart sounds.   No murmur heard. Pulmonary/Chest: Effort normal and breath sounds normal. No respiratory distress. She has no  wheezes. She has no rales. She exhibits no tenderness.  Abdominal: Soft. She exhibits no distension and no mass. There is no tenderness.  Musculoskeletal: She exhibits no edema.  R shoulder: TTP of posteior scapula with reproduction of pain. Certain positions with reproduction of pain. No TTP of chest. NVI distal R arm  Neurological: She is alert and oriented to person, place, and time. She exhibits normal muscle tone. Coordination normal.  Skin: Skin is warm. No rash noted. She is not diaphoretic.    ED Course  Procedures (including critical care time) Labs Review Labs Reviewed  BASIC METABOLIC PANEL - Abnormal; Notable for the following:    Creatinine, Ser 0.47 (*)    All other components within normal limits  CBC  I-STAT TROPOININ, ED    Imaging Review Dg Chest 2 View  05/18/2014   CLINICAL DATA:  Pain  right scapula.  EXAM: CHEST  2 VIEW  COMPARISON:  09/31/2015.  FINDINGS: Mediastinum hilar structures normal. Lungs are clear. Heart size normal. Surgical prep quadrant.  IMPRESSION: No active cardiopulmonary disease.   Electronically Signed   By: Maisie Fus  Register   On: 05/18/2014 15:03     EKG Interpretation   Date/Time:  Thursday May 18 2014 14:29:16 EDT Ventricular Rate:  78 PR Interval:  172 QRS Duration: 90 QT Interval:  404 QTC Calculation: 460 R Axis:   47 Text Interpretation:  Normal sinus rhythm Nonspecific T wave abnormality  No significant change since last tracing Confirmed by YAO  MD, DAVID  ([id number]) on 05/18/2014 7:40:19 PM      MDM   MDM: 43 y.o. W/ PMHx of CAD, subclavian artery dz, HLD, HTN w/ cc: of R shoulder pain. Pain for 1 week, has haed before as she injured her R shoulder in past. States that pain on posterior scapula, worse with movement. Denies chest pain, SOB, abd pain, n/v/d. Seen in ED 3 days ago and sent home. Seen at Cards yesterday and felt to be MSK. Neg EKG and Trop at EKG. AFVSS. NAD. Not c/w PE. Pt exam c/w MSK pain. Pt's main concern  is that NTG is helping pain. I cannot explain NTG helping, but patinet's exam and history are very c/w MSK pain. She has had two workups, including with cardiology, and I agree with rpevious. Neg Trop here, EKG unchanged. Will treat with Perc and valium. Pt has Cardiac Rehab tomorrow. Return if worsening. Discharged.  Final diagnoses:  Shoulder pain, acute, right    New Prescriptions   DIAZEPAM (VALIUM) 5 MG TABLET    Take 1 tablet (5 mg total) by mouth every 6 (six) hours as needed for anxiety.   OXYCODONE-ACETAMINOPHEN (PERCOCET/ROXICET) 5-325 MG PER TABLET    Take 1 tablet by mouth every 4 (four) hours as needed for severe pain.   [facility] EMERGENCY DEPARTMENT [address] [id number] [place] [address] [phone]  As needed   Discharged    Pilar Jarvis, MD 05/18/14 2013

## 2014-05-18 NOTE — ED Notes (Signed)
Attempted to draw pts labs was unsuccessful called phlebotomy and spoke with Angie. Phlebotomy to draw pts labs.  

## 2014-05-18 NOTE — ED Notes (Addendum)
Pt c/o right scapula pain that radiates into right arm and chest. sts she took 2 nitro today and the pain went away. Reports she was seen for the same on Monday and told everything was normal, followed up with cardiologist yesterday and told blood and ekg were good. Pt sts she is just concerned because the Nitro takes the pain away. Hx of MI and has 2 stents. Nad, skin warm and dry, resp e/u.

## 2014-05-18 NOTE — ED Notes (Signed)
Pt ambulating independently w/ steady gait on d/c in no acute distress, A&Ox4. D/c instructions reviewed w/ pt and family - pt and family deny any further questions or concerns at present. Rx given x3  

## 2014-05-19 ENCOUNTER — Encounter (HOSPITAL_COMMUNITY)
Admission: RE | Admit: 2014-05-19 | Discharge: 2014-05-19 | Disposition: A | Discharge status: Home / Self Care | Payer: BC Managed Care – PPO | Source: Ambulatory Visit | Attending: Cardiovascular Disease | Admitting: Cardiovascular Disease

## 2014-05-19 DIAGNOSIS — Z5189 Encounter for other specified aftercare: Secondary | ICD-10-CM | POA: Diagnosis not present

## 2014-05-22 ENCOUNTER — Telehealth (HOSPITAL_COMMUNITY): Payer: Self-pay | Admitting: General Practice

## 2014-05-22 ENCOUNTER — Encounter (HOSPITAL_COMMUNITY): Payer: BC Managed Care – PPO

## 2014-05-24 ENCOUNTER — Encounter (HOSPITAL_COMMUNITY): Payer: BC Managed Care – PPO

## 2014-05-24 ENCOUNTER — Telehealth (HOSPITAL_COMMUNITY): Payer: Self-pay | Admitting: Cardiac Rehabilitation

## 2014-05-24 NOTE — Telephone Encounter (Signed)
pc received from pt reporting continued shoulder discomfort. Pt reports pain has increased today resulting in need for pain medication. Pt states she has orthopaedic consultation scheduled Friday 05/26/14.  Pt will hold rehab until she has this evaluation and learns further recommendation and any activity restrictions.

## 2014-05-26 ENCOUNTER — Encounter (HOSPITAL_COMMUNITY): Payer: BC Managed Care – PPO

## 2014-05-29 ENCOUNTER — Encounter (HOSPITAL_COMMUNITY)
Admission: RE | Admit: 2014-05-29 | Discharge: 2014-05-29 | Disposition: A | Discharge status: Home / Self Care | Payer: BC Managed Care – PPO | Source: Ambulatory Visit | Attending: Cardiovascular Disease | Admitting: Cardiovascular Disease

## 2014-05-29 DIAGNOSIS — Z955 Presence of coronary angioplasty implant and graft: Secondary | ICD-10-CM | POA: Insufficient documentation

## 2014-05-29 DIAGNOSIS — I213 ST elevation (STEMI) myocardial infarction of unspecified site: Secondary | ICD-10-CM | POA: Insufficient documentation

## 2014-05-31 ENCOUNTER — Encounter (HOSPITAL_COMMUNITY)
Admission: RE | Admit: 2014-05-31 | Discharge: 2014-05-31 | Disposition: A | Discharge status: Home / Self Care | Payer: BC Managed Care – PPO | Source: Ambulatory Visit | Attending: Cardiovascular Disease | Admitting: Cardiovascular Disease

## 2014-05-31 DIAGNOSIS — I213 ST elevation (STEMI) myocardial infarction of unspecified site: Secondary | ICD-10-CM | POA: Diagnosis not present

## 2014-06-02 ENCOUNTER — Encounter (HOSPITAL_COMMUNITY)
Admission: RE | Admit: 2014-06-02 | Discharge: 2014-06-02 | Disposition: A | Discharge status: Home / Self Care | Payer: BC Managed Care – PPO | Source: Ambulatory Visit | Attending: Cardiovascular Disease | Admitting: Cardiovascular Disease

## 2014-06-02 DIAGNOSIS — I213 ST elevation (STEMI) myocardial infarction of unspecified site: Secondary | ICD-10-CM | POA: Diagnosis not present

## 2014-06-05 ENCOUNTER — Encounter (HOSPITAL_COMMUNITY): Payer: BC Managed Care – PPO

## 2014-06-07 ENCOUNTER — Encounter (HOSPITAL_COMMUNITY)
Admission: RE | Admit: 2014-06-07 | Discharge: 2014-06-07 | Disposition: A | Discharge status: Home / Self Care | Payer: BC Managed Care – PPO | Source: Ambulatory Visit | Attending: Cardiovascular Disease | Admitting: Cardiovascular Disease

## 2014-06-07 DIAGNOSIS — I213 ST elevation (STEMI) myocardial infarction of unspecified site: Secondary | ICD-10-CM | POA: Diagnosis not present

## 2014-06-09 ENCOUNTER — Encounter (HOSPITAL_COMMUNITY)
Admission: RE | Admit: 2014-06-09 | Discharge: 2014-06-09 | Disposition: A | Discharge status: Home / Self Care | Payer: BC Managed Care – PPO | Source: Ambulatory Visit | Attending: Cardiovascular Disease | Admitting: Cardiovascular Disease

## 2014-06-09 DIAGNOSIS — I213 ST elevation (STEMI) myocardial infarction of unspecified site: Secondary | ICD-10-CM | POA: Diagnosis not present

## 2014-06-12 ENCOUNTER — Encounter (HOSPITAL_COMMUNITY): Payer: BC Managed Care – PPO

## 2014-06-14 ENCOUNTER — Encounter (HOSPITAL_COMMUNITY)
Admission: RE | Admit: 2014-06-14 | Discharge: 2014-06-14 | Disposition: A | Discharge status: Home / Self Care | Payer: BC Managed Care – PPO | Source: Ambulatory Visit | Attending: Cardiovascular Disease | Admitting: Cardiovascular Disease

## 2014-06-14 DIAGNOSIS — I213 ST elevation (STEMI) myocardial infarction of unspecified site: Secondary | ICD-10-CM | POA: Diagnosis not present

## 2014-06-16 ENCOUNTER — Ambulatory Visit (HOSPITAL_COMMUNITY): Payer: BC Managed Care – PPO | Attending: Physician Assistant

## 2014-06-16 ENCOUNTER — Encounter (HOSPITAL_COMMUNITY): Payer: BC Managed Care – PPO

## 2014-06-16 DIAGNOSIS — E785 Hyperlipidemia, unspecified: Secondary | ICD-10-CM | POA: Insufficient documentation

## 2014-06-16 DIAGNOSIS — I255 Ischemic cardiomyopathy: Secondary | ICD-10-CM | POA: Diagnosis not present

## 2014-06-16 DIAGNOSIS — Z87891 Personal history of nicotine dependence: Secondary | ICD-10-CM | POA: Insufficient documentation

## 2014-06-16 NOTE — Progress Notes (Signed)
2D Echo completed. 06/16/2014 

## 2014-06-19 ENCOUNTER — Encounter (HOSPITAL_COMMUNITY)
Admission: RE | Admit: 2014-06-19 | Discharge: 2014-06-19 | Disposition: A | Discharge status: Home / Self Care | Payer: BC Managed Care – PPO | Source: Ambulatory Visit | Attending: Cardiovascular Disease | Admitting: Cardiovascular Disease

## 2014-06-19 ENCOUNTER — Telehealth: Payer: Self-pay | Admitting: Cardiovascular Disease

## 2014-06-19 NOTE — Telephone Encounter (Signed)
New Message  Pt returned call. Please call back

## 2014-06-19 NOTE — Telephone Encounter (Signed)
Spoke with pt and reviewed echo results with her and scheduled follow up appt with Dr. Clifton JamesMcAlhany for August 31, 2014

## 2014-06-19 NOTE — Progress Notes (Signed)
Patient informed. Inquiring when her next OV is. Will route to P. Meryl CrutchAdelman

## 2014-06-19 NOTE — Telephone Encounter (Signed)
Left message to call back  

## 2014-06-21 ENCOUNTER — Encounter (HOSPITAL_COMMUNITY)
Admission: RE | Admit: 2014-06-21 | Discharge: 2014-06-21 | Disposition: A | Discharge status: Home / Self Care | Payer: BC Managed Care – PPO | Source: Ambulatory Visit | Attending: Cardiovascular Disease | Admitting: Cardiovascular Disease

## 2014-06-21 DIAGNOSIS — I213 ST elevation (STEMI) myocardial infarction of unspecified site: Secondary | ICD-10-CM | POA: Diagnosis not present

## 2014-06-23 ENCOUNTER — Encounter (HOSPITAL_COMMUNITY)
Admission: RE | Admit: 2014-06-23 | Discharge: 2014-06-23 | Disposition: A | Discharge status: Home / Self Care | Payer: BC Managed Care – PPO | Source: Ambulatory Visit | Attending: Cardiovascular Disease | Admitting: Cardiovascular Disease

## 2014-06-23 DIAGNOSIS — I213 ST elevation (STEMI) myocardial infarction of unspecified site: Secondary | ICD-10-CM | POA: Diagnosis not present

## 2014-06-26 ENCOUNTER — Encounter (HOSPITAL_COMMUNITY)
Admission: RE | Admit: 2014-06-26 | Discharge: 2014-06-26 | Disposition: A | Discharge status: Home / Self Care | Payer: BC Managed Care – PPO | Source: Ambulatory Visit | Attending: Cardiovascular Disease | Admitting: Cardiovascular Disease

## 2014-06-26 DIAGNOSIS — I213 ST elevation (STEMI) myocardial infarction of unspecified site: Secondary | ICD-10-CM | POA: Insufficient documentation

## 2014-06-26 DIAGNOSIS — Z955 Presence of coronary angioplasty implant and graft: Secondary | ICD-10-CM | POA: Diagnosis present

## 2014-06-28 ENCOUNTER — Encounter (HOSPITAL_COMMUNITY): Payer: BC Managed Care – PPO

## 2014-06-29 ENCOUNTER — Telehealth: Payer: Self-pay | Admitting: Cardiovascular Disease

## 2014-06-29 NOTE — Telephone Encounter (Signed)
Pt has bulging disk at C3-C4. Ortho recommends epidural steroid injection to be done by Dr. Mikki Harboramos [facility].  Not scheduled yet. Pt inquiring about holding Effient for this procedure.  She takes 10 mg daily and a asa 81mg . Advised Dr. Clifton JamesMcAlhany and his nurse will be in the office tomorrow. Will forward to them for review/instructions.

## 2014-06-29 NOTE — Telephone Encounter (Signed)
She had overlapping drug eluting stents placed in the mid LAD and then had stent thrombosis. She cannot stop her Effient or ASA until July of 2016. I would tell her that she cannot have the epidural injection if it involves stopping her Effient. Stopping Effient could lead to stent thrombosis and death. Thanks, chris

## 2014-06-29 NOTE — Telephone Encounter (Signed)
New problem   Pt need to be cleared to have cortisone shot in her neck because of the bleeding risk. Please advise pt.

## 2014-06-30 ENCOUNTER — Encounter (HOSPITAL_COMMUNITY): Payer: BC Managed Care – PPO

## 2014-06-30 ENCOUNTER — Encounter: Payer: Self-pay | Admitting: *Deleted

## 2014-06-30 NOTE — Telephone Encounter (Signed)
Spoke with pt and told her letter would be at front desk for her to pick up. 

## 2014-06-30 NOTE — Telephone Encounter (Signed)
Spoke with pt and gave her information from Dr. Clifton JamesMcAlhany. She reports she is having issues with her job.  She works 4-5.5 hour shifts. She states it requires long periods of standing which cause her to become light headed at times.  This improves with sitting. She is asking for note that states she can sit and rest as needed.  Also requesting note to limit hours to 16 hours per week until seen by Dr. Clifton JamesMcAlhany on August 31, 2013

## 2014-06-30 NOTE — Telephone Encounter (Signed)
Left message to call back  

## 2014-06-30 NOTE — Telephone Encounter (Signed)
We can write a letter for her stating she can only work 16 hours per week until seen by me. Thanks, chris

## 2014-07-03 ENCOUNTER — Encounter (HOSPITAL_COMMUNITY): Payer: BC Managed Care – PPO

## 2014-07-05 ENCOUNTER — Encounter (HOSPITAL_COMMUNITY)
Admission: RE | Admit: 2014-07-05 | Discharge: 2014-07-05 | Disposition: A | Discharge status: Home / Self Care | Payer: BC Managed Care – PPO | Source: Ambulatory Visit | Attending: Cardiovascular Disease | Admitting: Cardiovascular Disease

## 2014-07-05 ENCOUNTER — Encounter (HOSPITAL_COMMUNITY): Payer: BC Managed Care – PPO

## 2014-07-05 DIAGNOSIS — I213 ST elevation (STEMI) myocardial infarction of unspecified site: Secondary | ICD-10-CM | POA: Diagnosis not present

## 2014-07-07 ENCOUNTER — Encounter (HOSPITAL_COMMUNITY)
Admission: RE | Admit: 2014-07-07 | Discharge: 2014-07-07 | Disposition: A | Discharge status: Home / Self Care | Payer: BC Managed Care – PPO | Source: Ambulatory Visit | Attending: Cardiovascular Disease | Admitting: Cardiovascular Disease

## 2014-07-07 ENCOUNTER — Encounter (HOSPITAL_COMMUNITY): Payer: BC Managed Care – PPO

## 2014-07-07 DIAGNOSIS — I213 ST elevation (STEMI) myocardial infarction of unspecified site: Secondary | ICD-10-CM | POA: Diagnosis not present

## 2014-07-10 ENCOUNTER — Encounter (HOSPITAL_COMMUNITY)
Admission: RE | Admit: 2014-07-10 | Discharge: 2014-07-10 | Disposition: A | Discharge status: Home / Self Care | Payer: BC Managed Care – PPO | Source: Ambulatory Visit | Attending: Cardiovascular Disease | Admitting: Cardiovascular Disease

## 2014-07-10 ENCOUNTER — Encounter (HOSPITAL_COMMUNITY): Payer: BC Managed Care – PPO

## 2014-07-10 DIAGNOSIS — I213 ST elevation (STEMI) myocardial infarction of unspecified site: Secondary | ICD-10-CM | POA: Diagnosis not present

## 2014-07-12 ENCOUNTER — Encounter (HOSPITAL_COMMUNITY): Payer: BC Managed Care – PPO

## 2014-07-12 ENCOUNTER — Encounter (HOSPITAL_COMMUNITY)
Admission: RE | Admit: 2014-07-12 | Discharge: 2014-07-12 | Disposition: A | Discharge status: Home / Self Care | Payer: BC Managed Care – PPO | Source: Ambulatory Visit | Attending: Cardiovascular Disease | Admitting: Cardiovascular Disease

## 2014-07-12 DIAGNOSIS — I213 ST elevation (STEMI) myocardial infarction of unspecified site: Secondary | ICD-10-CM | POA: Diagnosis not present

## 2014-07-14 ENCOUNTER — Encounter (HOSPITAL_COMMUNITY)
Admission: RE | Admit: 2014-07-14 | Discharge: 2014-07-14 | Disposition: A | Discharge status: Home / Self Care | Payer: BC Managed Care – PPO | Source: Ambulatory Visit | Attending: Cardiovascular Disease | Admitting: Cardiovascular Disease

## 2014-07-14 ENCOUNTER — Encounter (HOSPITAL_COMMUNITY): Payer: BC Managed Care – PPO

## 2014-07-14 DIAGNOSIS — I213 ST elevation (STEMI) myocardial infarction of unspecified site: Secondary | ICD-10-CM | POA: Diagnosis not present

## 2014-07-17 ENCOUNTER — Encounter (HOSPITAL_COMMUNITY)
Admission: RE | Admit: 2014-07-17 | Discharge: 2014-07-17 | Disposition: A | Discharge status: Home / Self Care | Payer: BC Managed Care – PPO | Source: Ambulatory Visit | Attending: Cardiovascular Disease | Admitting: Cardiovascular Disease

## 2014-07-17 ENCOUNTER — Encounter (HOSPITAL_COMMUNITY): Payer: BC Managed Care – PPO

## 2014-07-17 DIAGNOSIS — I213 ST elevation (STEMI) myocardial infarction of unspecified site: Secondary | ICD-10-CM | POA: Diagnosis not present

## 2014-07-19 ENCOUNTER — Encounter (HOSPITAL_COMMUNITY): Payer: BC Managed Care – PPO

## 2014-07-19 ENCOUNTER — Encounter (HOSPITAL_COMMUNITY)
Admission: RE | Admit: 2014-07-19 | Discharge: 2014-07-19 | Disposition: A | Discharge status: Home / Self Care | Payer: BC Managed Care – PPO | Source: Ambulatory Visit | Attending: Cardiovascular Disease | Admitting: Cardiovascular Disease

## 2014-07-19 DIAGNOSIS — I213 ST elevation (STEMI) myocardial infarction of unspecified site: Secondary | ICD-10-CM | POA: Diagnosis not present

## 2014-07-24 ENCOUNTER — Encounter (HOSPITAL_COMMUNITY): Payer: BC Managed Care – PPO

## 2014-07-24 ENCOUNTER — Encounter (HOSPITAL_COMMUNITY)
Admission: RE | Admit: 2014-07-24 | Discharge: 2014-07-24 | Disposition: A | Discharge status: Home / Self Care | Payer: BC Managed Care – PPO | Source: Ambulatory Visit | Attending: Cardiovascular Disease | Admitting: Cardiovascular Disease

## 2014-07-24 DIAGNOSIS — I213 ST elevation (STEMI) myocardial infarction of unspecified site: Secondary | ICD-10-CM | POA: Diagnosis not present

## 2014-07-26 ENCOUNTER — Encounter (HOSPITAL_COMMUNITY): Payer: BC Managed Care – PPO

## 2014-07-26 ENCOUNTER — Encounter (HOSPITAL_COMMUNITY)
Admission: RE | Admit: 2014-07-26 | Discharge: 2014-07-26 | Disposition: A | Discharge status: Home / Self Care | Payer: BC Managed Care – PPO | Source: Ambulatory Visit | Attending: Cardiovascular Disease | Admitting: Cardiovascular Disease

## 2014-07-26 DIAGNOSIS — I213 ST elevation (STEMI) myocardial infarction of unspecified site: Secondary | ICD-10-CM | POA: Insufficient documentation

## 2014-07-26 DIAGNOSIS — Z955 Presence of coronary angioplasty implant and graft: Secondary | ICD-10-CM | POA: Diagnosis present

## 2014-07-28 ENCOUNTER — Encounter (HOSPITAL_COMMUNITY)
Admission: RE | Admit: 2014-07-28 | Discharge: 2014-07-28 | Disposition: A | Discharge status: Home / Self Care | Payer: BC Managed Care – PPO | Source: Ambulatory Visit | Attending: Cardiovascular Disease | Admitting: Cardiovascular Disease

## 2014-07-28 ENCOUNTER — Encounter (HOSPITAL_COMMUNITY): Payer: BC Managed Care – PPO

## 2014-07-28 DIAGNOSIS — I213 ST elevation (STEMI) myocardial infarction of unspecified site: Secondary | ICD-10-CM | POA: Diagnosis not present

## 2014-07-31 ENCOUNTER — Encounter (HOSPITAL_COMMUNITY): Payer: BC Managed Care – PPO

## 2014-07-31 ENCOUNTER — Encounter (HOSPITAL_COMMUNITY)
Admission: RE | Admit: 2014-07-31 | Discharge: 2014-07-31 | Disposition: A | Discharge status: Home / Self Care | Payer: BC Managed Care – PPO | Source: Ambulatory Visit | Attending: Cardiovascular Disease | Admitting: Cardiovascular Disease

## 2014-07-31 DIAGNOSIS — I213 ST elevation (STEMI) myocardial infarction of unspecified site: Secondary | ICD-10-CM | POA: Diagnosis not present

## 2014-08-02 ENCOUNTER — Encounter (HOSPITAL_COMMUNITY): Payer: BC Managed Care – PPO

## 2014-08-02 ENCOUNTER — Encounter (HOSPITAL_COMMUNITY)
Admission: RE | Admit: 2014-08-02 | Discharge: 2014-08-02 | Disposition: A | Discharge status: Home / Self Care | Payer: BC Managed Care – PPO | Source: Ambulatory Visit | Attending: Cardiovascular Disease | Admitting: Cardiovascular Disease

## 2014-08-02 DIAGNOSIS — I213 ST elevation (STEMI) myocardial infarction of unspecified site: Secondary | ICD-10-CM | POA: Diagnosis not present

## 2014-08-03 ENCOUNTER — Encounter (HOSPITAL_COMMUNITY): Payer: Self-pay | Admitting: Cardiovascular Disease

## 2014-08-04 ENCOUNTER — Encounter (HOSPITAL_COMMUNITY): Payer: BC Managed Care – PPO

## 2014-08-07 ENCOUNTER — Encounter (HOSPITAL_COMMUNITY)
Admission: RE | Admit: 2014-08-07 | Discharge: 2014-08-07 | Disposition: A | Discharge status: Home / Self Care | Payer: BC Managed Care – PPO | Source: Ambulatory Visit | Attending: Cardiovascular Disease | Admitting: Cardiovascular Disease

## 2014-08-07 ENCOUNTER — Encounter (HOSPITAL_COMMUNITY): Payer: BC Managed Care – PPO

## 2014-08-07 DIAGNOSIS — I213 ST elevation (STEMI) myocardial infarction of unspecified site: Secondary | ICD-10-CM | POA: Diagnosis not present

## 2014-08-07 NOTE — Progress Notes (Signed)
Pt in today and reported to rehab staff that she has noticed that she has nausea after taking her lipitor in the evening. Pt advised to contact Dr. Clifton JamesMcalhany about her complaint for possible medication change.  Pt in agreement of this. Alanson Alyarlette Felix Pratt RN, BSN

## 2014-08-09 ENCOUNTER — Encounter (HOSPITAL_COMMUNITY)
Admission: RE | Admit: 2014-08-09 | Discharge: 2014-08-09 | Disposition: A | Discharge status: Home / Self Care | Payer: BC Managed Care – PPO | Source: Ambulatory Visit | Attending: Cardiovascular Disease | Admitting: Cardiovascular Disease

## 2014-08-09 ENCOUNTER — Encounter (HOSPITAL_COMMUNITY): Payer: BC Managed Care – PPO

## 2014-08-09 DIAGNOSIS — I213 ST elevation (STEMI) myocardial infarction of unspecified site: Secondary | ICD-10-CM | POA: Diagnosis not present

## 2014-08-11 ENCOUNTER — Telehealth: Payer: Self-pay | Admitting: *Deleted

## 2014-08-11 ENCOUNTER — Encounter (HOSPITAL_COMMUNITY)
Admission: RE | Admit: 2014-08-11 | Discharge: 2014-08-11 | Disposition: A | Discharge status: Home / Self Care | Payer: BC Managed Care – PPO | Source: Ambulatory Visit | Attending: Cardiovascular Disease | Admitting: Cardiovascular Disease

## 2014-08-11 ENCOUNTER — Encounter (HOSPITAL_COMMUNITY): Admission: RE | Admit: 2014-08-11 | Payer: BC Managed Care – PPO | Source: Ambulatory Visit

## 2014-08-11 DIAGNOSIS — I213 ST elevation (STEMI) myocardial infarction of unspecified site: Secondary | ICD-10-CM | POA: Diagnosis not present

## 2014-08-11 NOTE — Telephone Encounter (Signed)
PA for Effient sent via CoverMyMeds.

## 2014-08-12 ENCOUNTER — Other Ambulatory Visit: Payer: Self-pay | Admitting: Physician Assistant

## 2014-08-14 NOTE — Progress Notes (Signed)
Pt graduated from the Cardiac Rehab phase II program with the completion of 36 exercise sessions on 08/12/15.  Pt made some progress toward exercise goals due to neck issues and discomfort which prevented her from fully engaging into her full potential for exercise.   Pt did not attend any  educational classes often arriving too late or having to leave early.  Pt demonstrated a general  understanding of heart healthy living per educational test score 28/28.  Medication list reconciled.  Pt verbalized compliance to medication regimen.  Pt has not followed through on contacting her cardiologist regarding her concern for stating therapy.  Pt struggles from time to time with issues with depression.  Pt refuses to take medication or seek counseling.  Pt feels "she has" what it takes to get in a better head space. Pt would benefit from periodic check in to assess her well being mentally.  Pt has supportive husband who often accompanied her to exercise. Pt husband is a Physiological scientist for exercise and often has suggested to pt to exercise on the equipment he has available in the home to use.  Pt reluctant to do so due the neck issues.  Pt is under the care of a orthopedic physician and has been given exercise to do. Short term goal achieved - " so this would not happen again".  Pt remained cardiac event free during her participation.  Pt had one episode of ? Angina that head to ER visit and later determined to be orthopedic in nature verses cardiac.  Long term goal is increase stamina. Pt admits that she has partial met this goal she has increased stamina from the beginning of her participation but is not back to baseline.  Pt is hopeful that as time progress she will have even more stamina.  Pt is considering joining the maintenance program for continued exercise.  This would be helpful for pt to be involved in a group program for social interaction and for accountability for exercise.  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  Cherre Huger, BSN

## 2014-08-21 NOTE — Telephone Encounter (Signed)
PA for Effient refaxed via CoverMyMeds.

## 2014-08-31 ENCOUNTER — Encounter: Payer: Self-pay | Admitting: Cardiovascular Disease

## 2014-08-31 ENCOUNTER — Encounter: Payer: Self-pay | Admitting: *Deleted

## 2014-08-31 ENCOUNTER — Ambulatory Visit (INDEPENDENT_AMBULATORY_CARE_PROVIDER_SITE_OTHER): Payer: BLUE CROSS/BLUE SHIELD | Admitting: Cardiovascular Disease

## 2014-08-31 VITALS — BP 100/84 | HR 78 | Ht 63.0 in | Wt 147.0 lb

## 2014-08-31 DIAGNOSIS — I25119 Atherosclerotic heart disease of native coronary artery with unspecified angina pectoris: Secondary | ICD-10-CM

## 2014-08-31 DIAGNOSIS — F17201 Nicotine dependence, unspecified, in remission: Secondary | ICD-10-CM

## 2014-08-31 DIAGNOSIS — I739 Peripheral vascular disease, unspecified: Secondary | ICD-10-CM

## 2014-08-31 DIAGNOSIS — E785 Hyperlipidemia, unspecified: Secondary | ICD-10-CM

## 2014-08-31 DIAGNOSIS — I779 Disorder of arteries and arterioles, unspecified: Secondary | ICD-10-CM

## 2014-08-31 DIAGNOSIS — I255 Ischemic cardiomyopathy: Secondary | ICD-10-CM

## 2014-08-31 MED ORDER — ATORVASTATIN CALCIUM 10 MG PO TABS: 10.0000 mg | ORAL_TABLET | Freq: Every day | ORAL | Status: DC

## 2014-08-31 NOTE — Patient Instructions (Signed)
Your physician wants you to follow-up in:  6 months.  You will receive a reminder letter in the mail two months in advance. If you don't receive a letter, please call our office to schedule the follow-up appointment.  Your physician has recommended you make the following change in your medication: Start atorvastatin 10 mg by mouth daily

## 2014-08-31 NOTE — Progress Notes (Signed)
History of Present Illness: 44 yo female with history of CAD s/p anterior STEMI 02/2014 s/p DES x 2 to LAD c/b acute stent thrombosis shortly after requiring repeat PTCA/thrombectomy, ICM EF 40-45%, prior tobacco abuse, dyslipidemia, prior subclavian bypass for stenosis. She came to the ER 05/15/14 due to recurrence of posterior right shoulder "spasm" extending from her right arm and right chest into her left chest while driving to cardiac rehab. Pain was not similar to her prior MI, not made worse by anything, not provoked by anything in particular, and without any associated symptoms. Troponins were negative x 2 and EKG was felt to be stable. Echo 06/16/14 with LVEF=35-40%.   She is here today for follow up. She is feeling well. No chest pain or SOB. She does feel fatigue after strenuous activity. She continues to have intermittent posterior right shoulder blade pain and feels like there is a knot in this area. She is seeing Orthopedics and a Land.   Primary Care Physician: None  Last Lipid Profile:Lipid Panel     Component Value Date/Time   CHOL 139 05/17/2014 1453   TRIG 98.0 05/17/2014 1453   HDL 37.80* 05/17/2014 1453   CHOLHDL 4 05/17/2014 1453   VLDL 19.6 05/17/2014 1453   LDLCALC 82 05/17/2014 1453     Past Medical History  Diagnosis Date  . Tobacco abuse   . Subclavian artery disease     a. H/o subclavian artery stenosis s/p carotid/subclavian bypass.  Marland Kitchen NSVT (nonsustained ventricular tachycardia)   . CAD (coronary artery disease)     a. Anterior STEMI 02/2014 s/p PTCA/DESx2 to mLAD, residual moderate LAD disease - recurrent pain/ST elevation with emergent re-cath with acute stent thrombosis s/p PTCA, IVUS, thrombectomy of LAD.   . Ischemic cardiomyopathy     a. 02/2014: EF 40-45%, severe hypokinesis of mid-apical anteroseptal and apical myocardium, severe HK of mid-apical lateral myocardium.  Marland Kitchen Dyslipidemia     Past Surgical History  Procedure Laterality Date    . Cholecystectomy    . Carotid artery - subclavian artery bypass graft    . Abdominal hysterectomy    . Left heart catheterization with coronary angiogram N/A 03/13/2014    Procedure: LEFT HEART CATHETERIZATION WITH CORONARY ANGIOGRAM;  Surgeon: Kathleene Hazel, MD;  Location[facility] CATH LAB;  Service: Cardiovascular;  Laterality: N/A;    Current Outpatient Prescriptions  Medication Sig Dispense Refill  . aspirin 81 MG chewable tablet Chew 1 tablet (81 mg total) by mouth daily.    . cyclobenzaprine (FLEXERIL) 5 MG tablet Take 5 mg by mouth 3 (three) times daily as needed for muscle spasms.    . diazepam (VALIUM) 5 MG tablet   0  . EFFIENT 10 MG TABS tablet TAKE 1 TABLET BY MOUTH EVERY DAY 30 tablet 0  . lisinopril (PRINIVIL,ZESTRIL) 2.5 MG tablet Take 2.5 mg by mouth.    . metoprolol succinate (TOPROL-XL) 50 MG 24 hr tablet Take 1 tablet (50 mg total) by mouth daily. Take with or immediately following a meal. 30 tablet 5  . oxyCODONE-acetaminophen (PERCOCET/ROXICET) 5-325 MG per tablet Take 2 tablets by mouth 2 (two) times daily. Two daily by mouth for pain     No current facility-administered medications for this visit.    No Known Allergies  History   Social History  . Marital Status: Married    Spouse Name: N/A    Number of Children: N/A  . Years of Education: N/A   Occupational History  .  Not on file.   Social History Main Topics  . Smoking status: Former Smoker -- 1.00 packs/day for 25 years    Types: Cigarettes    Quit date: 03/13/2014  . Smokeless tobacco: Current User     Comment: vapor   . Alcohol Use: No  . Drug Use: No  . Sexual Activity: Yes    Birth Control/ Protection: None   Other Topics Concern  . Not on file   Social History Narrative    Family History  Problem Relation Age of Onset  . CAD Father     Review of Systems:  As stated in the HPI and otherwise negative.   BP 100/84 mmHg  Pulse 78  Ht 5\' 3"  (1.6 m)  Wt 147 lb (66.679 kg)   BMI 26.05 kg/m2  SpO2 98%  Physical Examination: General: Well developed, well nourished, NAD HEENT: OP clear, mucus membranes moist SKIN: warm, dry. No rashes. Neuro: No focal deficits Musculoskeletal: Muscle strength 5/5 all ext Psychiatric: Mood and affect normal Neck: No JVD, no carotid bruits, no thyromegaly, no lymphadenopathy. Lungs:Clear bilaterally, no wheezes, rhonci, crackles Cardiovascular: Regular rate and rhythm. No murmurs, gallops or rubs. Abdomen:Soft. Bowel sounds present. Non-tender.  Extremities: No lower extremity edema. Pulses are 2 + in the bilateral DP/PT.  Echo 06/16/14: Left ventricle: The cavity size was normal. There was mild focal basal hypertrophy of the septum. Systolic function was moderately reduced. The estimated ejection fraction was in the range of 35% to 40%. There is akinesis of the mid-apicalanteroseptal and lateral myocardium. Doppler parameters are consistent with abnormal left ventricular relaxation (grade 1 diastolic dysfunction). Impressions: - There is no significant change when compared to prior echocardiogram.  Cardiac cath 03/13/14:  Left main: 30% distal stenosis.  Left Anterior Descending Artery: Large caliber vessel that courses to the apex. The proximal vessel has diffuse 30% stenosis. There is a moderate caliber diagonal branch with ostial 50% stenosis. The mid LAD is totally occluded after the diagonal branch.  Circumflex Artery: Small to moderate caliber vessel with small Obtuse marginal branch. 40% mid stenosis.  Right Coronary Artery: Large dominant vessel with 50% proximal stenosis. 30% mid stenosis. Left Ventricular Angiogram: LVEF=40% with hypokinesis of the anterior wall and apex.   Assessment and Plan:   1. CAD: CAD with anterior STEMI 02/2014 s/p DESx2 to LAD complicated by acute stent thrombosis s/p PTCA/thrombectomy - doing well. Continue aspirin, statin, beta blocker, Effient  2. Ischemic  cardiomyopathy: LVEF 35-40% without CHF symptoms. Continue lisinopril and beta blocker.   3. HLD: She did not tolerate high dose statin. Will try Lipitor 10 mg daily.   4. Subclavian artery stenosis: No symptoms  5. Tobacco abuse in remission: She has stopped smoking.

## 2014-09-11 ENCOUNTER — Other Ambulatory Visit: Payer: Self-pay | Admitting: Physician Assistant

## 2014-09-12 ENCOUNTER — Telehealth: Payer: Self-pay | Admitting: Cardiovascular Disease

## 2014-09-12 NOTE — Telephone Encounter (Signed)
I contacted insurance and was told this was being submitted as a workmen's compensation claim.  I contacted [facility] for clarification and spoke with pharmacist. They ran Effient under regular insurance while on phone with me and copay is now $30.  No prior auth needed per pharmacist.

## 2014-09-12 NOTE — Telephone Encounter (Signed)
New Msg       [facility] pharmacy requesting prior auth for prescription Effient     Please call [phone] insurance company or [facility] at [phone] if any questions.

## 2014-10-12 ENCOUNTER — Other Ambulatory Visit: Payer: Self-pay | Admitting: Cardiovascular Disease

## 2014-10-18 ENCOUNTER — Other Ambulatory Visit: Payer: Self-pay

## 2014-10-18 ENCOUNTER — Telehealth: Payer: Self-pay | Admitting: Cardiovascular Disease

## 2014-10-18 MED ORDER — ATORVASTATIN CALCIUM 10 MG PO TABS: 10.0000 mg | ORAL_TABLET | Freq: Every day | ORAL | Status: DC

## 2014-10-18 MED ORDER — METOPROLOL SUCCINATE ER 50 MG PO TB24: ORAL_TABLET | ORAL | Status: DC

## 2014-10-18 MED ORDER — LISINOPRIL 2.5 MG PO TABS: 2.5000 mg | ORAL_TABLET | Freq: Every day | ORAL | Status: DC

## 2014-10-18 MED ORDER — PRASUGREL HCL 10 MG PO TABS: 10.0000 mg | ORAL_TABLET | Freq: Every day | ORAL | Status: DC

## 2014-10-18 NOTE — Telephone Encounter (Signed)
New message      Pt want to know to know if she can get a 90day supply on all of her medications at the next refill because she will not have ins.

## 2014-10-19 NOTE — Telephone Encounter (Signed)
F/U       Pt calling back about medications.    Please return call.

## 2014-10-23 NOTE — Telephone Encounter (Signed)
Called patient to inform her that 60 days worth of Effient 10 mg samples could be provided for her only at this time. They will be held up at the front desk for her to pick up. Patient verbalized appreciation.

## 2014-10-23 NOTE — Telephone Encounter (Signed)
F/U        Pt needs 90 day supply of Effient or prescription drug discount card.   Doesn't have insurance until 30 days.   Please return call.

## 2014-11-28 ENCOUNTER — Telehealth: Payer: Self-pay | Admitting: Cardiovascular Disease

## 2014-11-28 NOTE — Telephone Encounter (Signed)
New message     Pt has a question about whether she can take a medication----she would not give me any more info

## 2014-11-28 NOTE — Telephone Encounter (Signed)
Pt has seen a orthopedic MD, her attorney has recommended pt see a neurologist for a second opinion.  Pt states her attorney suggested she call Dr Clifton JamesMcAlhany to ask when she could hold Effient if she needed surgery. Pt had a DES in July 2015.

## 2014-11-28 NOTE — Telephone Encounter (Signed)
Pt states that she has a neck injury that is work related.

## 2014-11-28 NOTE — Telephone Encounter (Signed)
Pt advised usual recommendation is to continue Effient for 1 year after DES before holding for surgery, if surgery is urgent can probably hold Effient 6 months after DES.  Pt also asking if Ok with Dr Clifton JamesMcAlhany for her to take E-Carnatine, OTC supplement naturally occuring in red meat that she wants to take for weight loss.   Pt advised I will forward to Dr Clifton JamesMcAlhany for review and recommendations

## 2014-11-29 NOTE — Telephone Encounter (Signed)
Left message to call back  

## 2014-11-29 NOTE — Telephone Encounter (Signed)
I would not hold Effient until July 2016 unless surgery is urgent. L-carnitine is an amino acid and should be safe to use. cdm

## 2014-12-05 NOTE — Telephone Encounter (Signed)
Spoke with pt and gave her information from Dr. McAlhany 

## 2015-01-08 ENCOUNTER — Telehealth: Payer: Self-pay | Admitting: Cardiovascular Disease

## 2015-01-08 NOTE — Telephone Encounter (Signed)
New Message  Pt calling about effiant 10 mg samples. Please call back and discuss.

## 2015-02-06 ENCOUNTER — Other Ambulatory Visit: Payer: Self-pay

## 2015-02-06 MED ORDER — METOPROLOL SUCCINATE ER 50 MG PO TB24: ORAL_TABLET | ORAL | Status: DC

## 2015-04-02 ENCOUNTER — Ambulatory Visit (INDEPENDENT_AMBULATORY_CARE_PROVIDER_SITE_OTHER): Payer: BLUE CROSS/BLUE SHIELD | Admitting: Physician Assistant

## 2015-04-02 ENCOUNTER — Encounter: Payer: Self-pay | Admitting: Physician Assistant

## 2015-04-02 VITALS — BP 120/80 | HR 81 | Ht 63.0 in | Wt 158.0 lb

## 2015-04-02 DIAGNOSIS — I25119 Atherosclerotic heart disease of native coronary artery with unspecified angina pectoris: Secondary | ICD-10-CM

## 2015-04-02 DIAGNOSIS — E785 Hyperlipidemia, unspecified: Secondary | ICD-10-CM

## 2015-04-02 NOTE — Assessment & Plan Note (Signed)
Patient had anterior STEMI treated with PTCA/DES 2 to the mid LAD with residual moderate LAD disease recurrent pain ST elevation with emergency re-Acute Stent Thrombosis Status Post PTCA, IVIS, Thrombectomy of the LAD 02/2014. Patient is doing well without symptoms. Recommend continue Effient for now. Will discuss with Dr. Clifton James the possibility of stopping it briefly for her to get a tattoo.

## 2015-04-02 NOTE — Assessment & Plan Note (Signed)
Check fasting lipid panel and LFTs. Patient will return for this.

## 2015-04-02 NOTE — Patient Instructions (Signed)
Medication Instructions:  Your physician recommends that you continue on your current medications as directed. Please refer to the Current Medication list given to you today.   Labwork: Your physician recommends that you return for lab work fasting for a LIPID/LIVER   Testing/Procedures: None ordered  Follow-Up: Your physician wants you to follow-up in: 6 months with Dr Lisette Grinder will receive a reminder letter in the mail two months in advance. If you don't receive a letter, please call our office to schedule the follow-up appointment.   Any Other Special Instructions Will Be Listed Below (If Applicable).

## 2015-04-02 NOTE — Progress Notes (Signed)
Cardiology Office Note   Date:  04/02/2015   ID:  VARA MAIRENA, DOB 06-Sep-1970, MRN [id number]  PCP:  No PCP Per Patient  Cardiologist:  Dr. Mike Craze  Chief Complaint: Bruising    History of Present Illness: Shannon [name] is a 44 y.o. female who presents for yearly f/u. She has history of CAD status post anterior STEMI 02/2014 treated with DES 2 to the LAD because of acute stent thrombosis shortly after requiring repeat PTCA/thrombectomy. She has an ischemic cardiomyopathy EF 40-45%, prior tobacco abuse, dyslipidemia, prior subclavian bypass for stenosis. She had an emergency room visit 04/2014 for spasm in her right arm and right chest that went into her left chest. Troponins were negative 2 and EKG was felt to be stable. 2-D echo 06/16/14 EF 35-40%. She last saw Dr.McAlhany January 2016 at which time she was doing well. She was having right shoulder problems and he asked her not to have surgery until after one year from her DES.  Patient comes in today feeling quite well. She stopped exercising as she was taking care of her husband who was in a tractor-trailer accident. She does have chronic back pain which limits how much she can walk. She denies any chest pain, palpitations, dyspnea, dyspnea on exertion, dizziness or presyncope. She does fatigue easily. She does not smoke. She has anxiety about stopping the Effient since she is a year out from her MI, but she would like to get a tattoo. She does bruise easily on the Effient. No melanoma or nosebleeds or bleeding that doesn't stop.   Past Medical History  Diagnosis Date  . Tobacco abuse   . Subclavian artery disease     a. H/o subclavian artery stenosis s/p carotid/subclavian bypass.  Marland Kitchen NSVT (nonsustained ventricular tachycardia)   . CAD (coronary artery disease)     a. Anterior STEMI 02/2014 s/p PTCA/DESx2 to mLAD, residual moderate LAD disease - recurrent pain/ST elevation with emergent re-cath with acute stent thrombosis s/p PTCA,  IVUS, thrombectomy of LAD.   . Ischemic cardiomyopathy     a. 02/2014: EF 40-45%, severe hypokinesis of mid-apical anteroseptal and apical myocardium, severe HK of mid-apical lateral myocardium.  Marland Kitchen Dyslipidemia     Past Surgical History  Procedure Laterality Date  . Cholecystectomy    . Carotid artery - subclavian artery bypass graft    . Abdominal hysterectomy    . Left heart catheterization with coronary angiogram N/A 03/13/2014    Procedure: LEFT HEART CATHETERIZATION WITH CORONARY ANGIOGRAM;  Surgeon: Kathleene Hazel, MD;  Location: [facility] CATH LAB;  Service: Cardiovascular;  Laterality: N/A;     Current Outpatient Prescriptions  Medication Sig Dispense Refill  . aspirin 81 MG chewable tablet Chew 1 tablet (81 mg total) by mouth daily.    . cyclobenzaprine (FLEXERIL) 5 MG tablet Take 5 mg by mouth 3 (three) times daily as needed for muscle spasms.    Marland Kitchen lisinopril (PRINIVIL,ZESTRIL) 2.5 MG tablet Take 1 tablet (2.5 mg total) by mouth daily. 90 tablet 1  . metoprolol succinate (TOPROL-XL) 50 MG 24 hr tablet TAKE 1 TABLET BY MOUTH EVERY DAY WITH OR IMMEDIATELY AFTER A MEAL 90 tablet 0  . oxyCODONE-acetaminophen (PERCOCET/ROXICET) 5-325 MG per tablet Take 2 tablets by mouth 2 (two) times daily. Two daily by mouth for pain    . prasugrel (EFFIENT) 10 MG TABS tablet Take 1 tablet (10 mg total) by mouth daily. 90 tablet 1   No current facility-administered medications for this visit.  Allergies:   Review of patient's allergies indicates no known allergies.    Social History:  The patient  reports that she quit smoking about 12 months ago. Her smoking use included Cigarettes. She has a 25 pack-year smoking history. She uses smokeless tobacco. She reports that she does not drink alcohol or use illicit drugs.   Family History:  The patient's   family history includes CAD in her father; Heart attack in her maternal grandfather and mother; Hypertension in her maternal grandfather,  maternal grandmother, and mother; Stroke in her maternal grandmother.    ROS:  Please see the history of present illness.   Otherwise, review of systems are positive for none.   All other systems are reviewed and negative.    PHYSICAL EXAM: VS:  BP 120/80 mmHg  Pulse 81  Ht 5\' 3"  (1.6 m)  Wt 158 lb (71.668 kg)  BMI 28.00 kg/m2 , BMI Body mass index is 28 kg/(m^2). GEN: Well nourished, well developed, in no acute distress Neck: no JVD, HJR, carotid bruits, or masses Cardiac:  RRR; no murmurs,gallop, rubs, thrill or heave,  Respiratory:  clear to auscultation bilaterally, normal work of breathing GI: soft, nontender, nondistended, + BS MS: no deformity or atrophy Extremities: without cyanosis, clubbing, edema, good distal pulses bilaterally.  Skin: warm and dry, no rash Neuro:  Strength and sensation are intact    EKG:  EKG is ordered today. The ekg ordered today demonstrates normal sinus rhythm incomplete right bundle branch block, no acute change   Recent Labs: 05/15/2014: Pro B Natriuretic peptide (BNP) 727.6* 05/17/2014: ALT 18 05/18/2014: BUN 13; Creatinine, Ser 0.47*; Hemoglobin 13.6; Platelets 335; Potassium 4.3; Sodium 140    Lipid Panel    Component Value Date/Time   CHOL 139 05/17/2014 1453   TRIG 98.0 05/17/2014 1453   HDL 37.80* 05/17/2014 1453   CHOLHDL 4 05/17/2014 1453   VLDL 19.6 05/17/2014 1453   LDLCALC 82 05/17/2014 1453      Wt Readings from Last 3 Encounters:  04/02/15 158 lb (71.668 kg)  08/31/14 147 lb (66.679 kg)  05/18/14 138 lb (62.596 kg)      Other studies Reviewed: Additional studies/ records that were reviewed today include and review of the records demonstrates:  Echo 06/16/14: Left ventricle: The cavity size was normal. There was mild focal   basal hypertrophy of the septum. Systolic function was moderately   reduced. The estimated ejection fraction was in the range of 35%   to 40%. There is akinesis of the mid-apicalanteroseptal  and   lateral myocardium. Doppler parameters are consistent with   abnormal left ventricular relaxation (grade 1 diastolic   dysfunction). Impressions: - There is no significant change when compared to prior   echocardiogram.  Cardiac cath 03/13/14:  Left main: 30% distal stenosis.   Left Anterior Descending Artery: Large caliber vessel that courses to the apex. The proximal vessel has diffuse 30% stenosis. There is a moderate caliber diagonal branch with ostial 50% stenosis. The mid LAD is totally occluded after the diagonal branch.   Circumflex Artery: Small to moderate caliber vessel with small Obtuse marginal branch. 40% mid stenosis.   Right Coronary Artery: Large dominant vessel with 50% proximal stenosis. 30% mid stenosis. Left Ventricular Angiogram: LVEF=40% with hypokinesis of the anterior wall and apex.    ASSESSMENT AND PLAN:  CAD (coronary artery disease) Patient had anterior STEMI treated with PTCA/DES 2 to the mid LAD with residual moderate LAD disease recurrent pain ST elevation with  emergency re-Acute Stent Thrombosis Status Post PTCA, IVIS, Thrombectomy of the LAD 02/2014. Patient is doing well without symptoms. Recommend continue Effient for now. Will discuss with Dr. Clifton James the possibility of stopping it briefly for her to get a tattoo.  Dyslipidemia Check fasting lipid panel and LFTs. Patient will return for this.       Elson Clan, PA-C  04/02/2015 11:34 AM    [facility] [address] [place], [place], [place]  [address] Phone: [phone]; Fax: [phone]

## 2015-04-03 ENCOUNTER — Other Ambulatory Visit: Payer: Self-pay | Admitting: *Deleted

## 2015-04-03 MED ORDER — PRASUGREL HCL 10 MG PO TABS: 10.0000 mg | ORAL_TABLET | Freq: Every day | ORAL | Status: DC

## 2015-04-03 MED ORDER — LISINOPRIL 2.5 MG PO TABS: 2.5000 mg | ORAL_TABLET | Freq: Every day | ORAL | Status: DC

## 2015-04-04 ENCOUNTER — Other Ambulatory Visit (INDEPENDENT_AMBULATORY_CARE_PROVIDER_SITE_OTHER): Payer: BLUE CROSS/BLUE SHIELD | Admitting: *Deleted

## 2015-04-04 DIAGNOSIS — E785 Hyperlipidemia, unspecified: Secondary | ICD-10-CM | POA: Diagnosis not present

## 2015-04-04 LAB — HEPATIC FUNCTION PANEL
ALBUMIN: 4.1 g/dL (ref 3.5–5.2)
ALT: 20 U/L (ref 0–35)
AST: 17 U/L (ref 0–37)
Alkaline Phosphatase: 90 U/L (ref 39–117)
BILIRUBIN TOTAL: 0.4 mg/dL (ref 0.2–1.2)
Bilirubin, Direct: 0.1 mg/dL (ref 0.0–0.3)
Total Protein: 7 g/dL (ref 6.0–8.3)

## 2015-04-04 LAB — LIPID PANEL
CHOLESTEROL: 209 mg/dL — AB (ref 0–200)
HDL: 37.1 mg/dL — AB (ref >39.00)
NonHDL: 171.83
Total CHOL/HDL Ratio: 6
Triglycerides: 275 mg/dL — ABNORMAL HIGH (ref 0.0–149.0)
VLDL: 55 mg/dL — ABNORMAL HIGH (ref 0.0–40.0)

## 2015-04-04 LAB — LDL CHOLESTEROL, DIRECT: LDL DIRECT: 134 mg/dL

## 2015-04-04 LAB — BASIC METABOLIC PANEL
BUN: 11 mg/dL (ref 6–23)
CO2: 29 mEq/L (ref 19–32)
Calcium: 9.1 mg/dL (ref 8.4–10.5)
Chloride: 104 mEq/L (ref 96–112)
Creatinine, Ser: 0.56 mg/dL (ref 0.40–1.20)
GFR: 124.52 mL/min (ref >60.00)
Glucose, Bld: 91 mg/dL (ref 70–99)
POTASSIUM: 3.5 meq/L (ref 3.5–5.1)
SODIUM: 140 meq/L (ref 135–145)

## 2015-04-04 NOTE — Addendum Note (Signed)
Addended by: Tonita Phoenix on: 04/04/2015 10:49 AM   Modules accepted: Orders

## 2015-04-04 NOTE — Addendum Note (Signed)
Addended by: Tonita Phoenix on: 04/04/2015 10:48 AM   Modules accepted: Orders

## 2015-04-04 NOTE — Progress Notes (Signed)
Dr. Clifton James recommends continuing Effient and not stopping to get a tattoo

## 2015-04-05 ENCOUNTER — Telehealth: Payer: Self-pay

## 2015-04-05 MED ORDER — PRAVASTATIN SODIUM 40 MG PO TABS: 40.0000 mg | ORAL_TABLET | Freq: Every evening | ORAL | Status: DC

## 2015-04-05 NOTE — Telephone Encounter (Signed)
Called patient about Dr. Gibson Ramp note. Patient will try Pravachol 40 mg daily. Will send in prescription to patient's pharmacy. Patient verbalized understanding.

## 2015-04-10 ENCOUNTER — Telehealth: Payer: Self-pay | Admitting: *Deleted

## 2015-04-10 NOTE — Telephone Encounter (Signed)
Yevonne Aline, MD  Sent: Tue April 03, 2015 3:11 PM   To: Dyann Kief, PA-C   Cc: Dossie Arbour, RN     Message    Elon Jester, I would like to continue her Effient for now. We can consider switching to Plavix at her next visit but given her history, I would not recommend stopping for a tattoo at this time.       Thayer Ohm    I placed call to pt to give her information above from Dr. Clifton James. Left message to call back

## 2015-04-11 NOTE — Telephone Encounter (Signed)
Pt notified of information from Dr. McAlhany 

## 2015-04-14 ENCOUNTER — Other Ambulatory Visit: Payer: Self-pay | Admitting: Cardiovascular Disease

## 2015-09-17 ENCOUNTER — Telehealth: Payer: Self-pay | Admitting: Cardiovascular Disease

## 2015-09-17 ENCOUNTER — Other Ambulatory Visit: Payer: Self-pay | Admitting: Cardiovascular Disease

## 2015-09-17 NOTE — Telephone Encounter (Signed)
New message   Surgical clearance policy was inform to patient.  Patient does not understand why she can't come off her medication due to broken tooth over the weekend.   Would like a message sent to nurse

## 2015-09-17 NOTE — Telephone Encounter (Signed)
Spoke with pt. She has not seen dentist yet but she thinks she may need to have tooth removed. She is asking if she would be able to stop Effient for this if needed.  If OK to stop she will schedule appt with dentist. If not able to stop she does not plan to schedule dental appt.

## 2015-09-18 NOTE — Telephone Encounter (Signed)
Spoke with pt and told her she could hold Effient if procedure planned.  She is seeing dentist tomorrow. She will have dentist send [place] letter outlining what is need if procedure planned.

## 2015-09-18 NOTE — Telephone Encounter (Signed)
She can hold her Effient for 7 days before her dental procedure. cdm

## 2015-09-18 NOTE — Telephone Encounter (Signed)
Left message to call back  

## 2015-09-20 NOTE — Telephone Encounter (Signed)
Clearance form given to Coumadin clinic - printed Dr. Gibson Ramp clearance to hold Effient x7 days and faxed back to dentist office on 09/20/15.

## 2015-10-11 ENCOUNTER — Other Ambulatory Visit: Payer: Self-pay | Admitting: Cardiovascular Disease

## 2015-10-16 ENCOUNTER — Other Ambulatory Visit: Payer: Self-pay | Admitting: *Deleted

## 2015-10-16 MED ORDER — LISINOPRIL 2.5 MG PO TABS: 2.5000 mg | ORAL_TABLET | Freq: Every day | ORAL | Status: DC

## 2015-10-16 MED ORDER — PRASUGREL HCL 10 MG PO TABS: 10.0000 mg | ORAL_TABLET | Freq: Every day | ORAL | Status: DC

## 2015-10-22 ENCOUNTER — Encounter: Payer: Self-pay | Admitting: Physician Assistant

## 2015-10-22 ENCOUNTER — Ambulatory Visit (INDEPENDENT_AMBULATORY_CARE_PROVIDER_SITE_OTHER): Payer: BLUE CROSS/BLUE SHIELD | Admitting: Physician Assistant

## 2015-10-22 VITALS — BP 104/78 | HR 73 | Ht 63.0 in | Wt 162.8 lb

## 2015-10-22 DIAGNOSIS — I2510 Atherosclerotic heart disease of native coronary artery without angina pectoris: Secondary | ICD-10-CM

## 2015-10-22 DIAGNOSIS — E785 Hyperlipidemia, unspecified: Secondary | ICD-10-CM

## 2015-10-22 DIAGNOSIS — Z720 Tobacco use: Secondary | ICD-10-CM | POA: Diagnosis not present

## 2015-10-22 DIAGNOSIS — I213 ST elevation (STEMI) myocardial infarction of unspecified site: Secondary | ICD-10-CM

## 2015-10-22 DIAGNOSIS — I255 Ischemic cardiomyopathy: Secondary | ICD-10-CM | POA: Diagnosis not present

## 2015-10-22 MED ORDER — NITROGLYCERIN 0.4 MG SL SUBL: 0.4000 mg | SUBLINGUAL_TABLET | SUBLINGUAL | Status: DC | PRN

## 2015-10-22 NOTE — Patient Instructions (Signed)
Medication Instructions:   Your physician recommends that you continue on your current medications as directed. Please refer to the Current Medication list given to you today.    If you need a refill on your cardiac medications before your next appointment, please call your pharmacy.  Labwork:  RETURN FOR FASTING LIPIDS LFT CMET CBC  Testing/Procedures: Your physician has requested that you have an echocardiogram. Echocardiography is a painless test that uses sound waves to create images of your heart. It provides your doctor with information about the size and shape of your heart and how well your heart's chambers and valves are working. This procedure takes approximately one hour. There are no restrictions for this procedure.     Follow-Up: DR Clifton James IN 4 TO 6 MONTHS    Any Other Special Instructions Will Be Listed Below (If Applicable).

## 2015-10-22 NOTE — Assessment & Plan Note (Signed)
Patient's EF was 35-40% in October 2015. Will reevaluate with 2-D echo  In hopes of recovering give patient reassurance.

## 2015-10-22 NOTE — Assessment & Plan Note (Signed)
Patient still is getting nicotine in her vape.  I recommend she get rid of all nicotine.

## 2015-10-22 NOTE — Progress Notes (Signed)
[facility] Office Note   Date:  10/22/2015   ID:  Shannon [name], DOB 02-04-1971, MRN [id number]  PCP:  No PCP Per Patient  Cardiologist:  Dr. Mike Craze  Chief Complaint: Bruising    History of Present Illness: Shannon [name] is a 45 y.o. female who presents for  Six-month f/u. She has history of CAD status post anterior STEMI 02/2014 treated with DES 2 to the LAD because of acute stent thrombosis shortly after requiring repeat PTCA/thrombectomy. She has an ischemic cardiomyopathy EF 40-45%, prior tobacco abuse, dyslipidemia, prior subclavian bypass for stenosis. She had an emergency room visit 04/2014 for spasm in her right arm and right chest that went into her left chest. Troponins were negative 2 and EKG was felt to be stable. 2-D echo 06/16/14 EF 35-40%. She last saw Dr.McAlhany January 2016 at which time she was doing well. She was having right shoulder problems and he asked her not to have surgery until after one year from her DES. I saw the patient and 03/2015 at which time she was doing well. She wanted to stop Effient to get a tattoo but Dr. Clifton James recommended that she not do this.   The patient is here today accompanied by her husband. He says he is very stubborn. She continues to smoke a vape that has minimal nicotine but she's not willing to get GERD of it. She hates to exercise so is not doing any type of exercise. She does have back problems so impact bothers her. They're getting a bike next week in hopes that she will exercise. She has improved her diet some. She is still asking to stop her blood thinner to get a tattoo. She denies any chest pain, palpitations, dyspnea, dyspnea on exertion, dizziness or presyncope. She still says she is weak. She still has a lot of anxiety about going to sleep been worried she is going to die in her sleep from a heart attack. She does not have a primary care doctor and has not seen a gynecologist in many years because she had a hysterectomy. She  hasn't had a mammogram either.  Past Medical History  Diagnosis Date  . Tobacco abuse   . Subclavian artery disease (HCC)     a. H/o subclavian artery stenosis s/p carotid/subclavian bypass.  Marland Kitchen NSVT (nonsustained ventricular tachycardia) (HCC)   . CAD (coronary artery disease)     a. Anterior STEMI 02/2014 s/p PTCA/DESx2 to mLAD, residual moderate LAD disease - recurrent pain/ST elevation with emergent re-cath with acute stent thrombosis s/p PTCA, IVUS, thrombectomy of LAD.   . Ischemic cardiomyopathy     a. 02/2014: EF 40-45%, severe hypokinesis of mid-apical anteroseptal and apical myocardium, severe HK of mid-apical lateral myocardium.  Marland Kitchen Dyslipidemia     Past Surgical History  Procedure Laterality Date  . Cholecystectomy    . Carotid artery - subclavian artery bypass graft    . Abdominal hysterectomy    . Left heart catheterization with coronary angiogram N/A 03/13/2014    Procedure: LEFT HEART CATHETERIZATION WITH CORONARY ANGIOGRAM;  Surgeon: Kathleene Hazel, MD;  Location: [facility] CATH LAB;  Service: Cardiovascular;  Laterality: N/A;     Current Outpatient Prescriptions  Medication Sig Dispense Refill  . aspirin 81 MG chewable tablet Chew 1 tablet (81 mg total) by mouth daily.    . cyclobenzaprine (FLEXERIL) 5 MG tablet Take 5 mg by mouth 3 (three) times daily as needed for muscle spasms.    Marland Kitchen lisinopril (PRINIVIL,ZESTRIL) 2.5 MG  tablet Take 1 tablet (2.5 mg total) by mouth daily. 90 tablet 1  . oxyCODONE-acetaminophen (PERCOCET/ROXICET) 5-325 MG per tablet Take 2 tablets by mouth 2 (two) times daily. Two daily by mouth for pain    . prasugrel (EFFIENT) 10 MG TABS tablet Take 1 tablet (10 mg total) by mouth daily. 90 tablet 1  . pravastatin (PRAVACHOL) 40 MG tablet Take 1 tablet (40 mg total) by mouth every evening. 30 tablet 11  . TOPROL XL 50 MG 24 hr tablet Take 50 mg by mouth daily.      No current facility-administered medications for this visit.    Allergies:    Review of patient's allergies indicates no known allergies.    Social History:  The patient  reports that she quit smoking about 19 months ago. Her smoking use included Cigarettes. She has a 25 pack-year smoking history. She uses smokeless tobacco. She reports that she does not drink alcohol or use illicit drugs.   Family History:  The patient's   family history includes CAD in her father; Heart attack in her maternal grandfather and mother; Hypertension in her maternal grandfather, maternal grandmother, and mother; Stroke in her maternal grandmother.    ROS:  Please see the history of present illness.   Otherwise, review of systems are positive for none.   All other systems are reviewed and negative.    PHYSICAL EXAM: VS:  BP 104/78 mmHg  Pulse 73  Ht  (1.6 m)  Wt 162 lb 12.8 oz (73.846 kg)  BMI 28.85 kg/m2 , BMI Body mass index is 28.85 kg/(m^2). GEN: Well nourished, well developed, in no acute distress Neck: no JVD, HJR, carotid bruits, or masses Cardiac:  RRR; no murmurs,gallop, rubs, thrill or heave,  Respiratory:  clear to auscultation bilaterally, normal work of breathing GI: soft, nontender, nondistended, + BS MS: no deformity or atrophy Extremities: without cyanosis, clubbing, edema, good distal pulses bilaterally.  Skin: warm and dry, no rash Neuro:  Strength and sensation are intact    EKG:  EKG is ordered today. The ekg ordered today demonstrates normal sinus rhythm incomplete right bundle branch block, no acute change   Recent Labs: 04/04/2015: ALT 20; BUN 11; Creatinine, Ser 0.56; Potassium 3.5; Sodium 140    Lipid Panel    Component Value Date/Time   CHOL 209* 04/04/2015 1049   TRIG 275.0* 04/04/2015 1049   HDL 37.10* 04/04/2015 1049   CHOLHDL 6 04/04/2015 1049   VLDL 55.0* 04/04/2015 1049   LDLCALC 82 05/17/2014 1453   LDLDIRECT 134.0 04/04/2015 1049      Wt Readings from Last 3 Encounters:  10/22/15 162 lb 12.8 oz (73.846 kg)  04/02/15 158 lb  (71.668 kg)  08/31/14 147 lb (66.679 kg)      Other studies Reviewed: Additional studies/ records that were reviewed today include and review of the records demonstrates:  Echo 06/16/14: Left ventricle: The cavity size was normal. There was mild focal   basal hypertrophy of the septum. Systolic function was moderately   reduced. The estimated ejection fraction was in the range of 35%   to 40%. There is akinesis of the mid-apicalanteroseptal and   lateral myocardium. Doppler parameters are consistent with   abnormal left ventricular relaxation (grade 1 diastolic   dysfunction). Impressions: - There is no significant change when compared to prior   echocardiogram.  Cardiac cath 03/13/14:  Left main: 30% distal stenosis.   Left Anterior Descending Artery: Large caliber vessel that courses to the  apex. The proximal vessel has diffuse 30% stenosis. There is a moderate caliber diagonal branch with ostial 50% stenosis. The mid LAD is totally occluded after the diagonal branch.   Circumflex Artery: Small to moderate caliber vessel with small Obtuse marginal branch. 40% mid stenosis.   Right Coronary Artery: Large dominant vessel with 50% proximal stenosis. 30% mid stenosis. Left Ventricular Angiogram: LVEF=40% with hypokinesis of the anterior wall and apex.    ASSESSMENT AND PLAN:   CAD (coronary artery disease)  Patient has stable without chest pain. She does need to increase her exercise routine. Continue aspirin ,Effient,  Lisinopril, metoprolol and pravastatin. Follow-up with Dr. Clifton James in 4-6 months. Recommend the patient obtain a primary care physician and appointment with her gynecologist  As well as a mammogram. We'll check labs today.  Ischemic cardiomyopathy  Patient's EF was 35-40% in October 2015. Will reevaluate with 2-D echo  In hopes of recovering give patient reassurance.  Tobacco use  Patient still is getting nicotine in her vape.  I recommend she get rid of all  nicotine.  Dyslipidemia  Check fasting lipid panel and LFTs.       Elson Clan, PA-C  10/22/2015 12:32 PM    [facility] [address] [place], [place], [place]  [address] Phone: [phone]; Fax: [phone]

## 2015-10-22 NOTE — Assessment & Plan Note (Signed)
Check fasting lipid panel and LFTs 

## 2015-10-22 NOTE — Assessment & Plan Note (Addendum)
Patient has stable without chest pain. She does need to increase her exercise routine. Continue aspirin ,Effient,  Lisinopril, metoprolol and pravastatin. Follow-up with Dr. Clifton James in 4-6 months. Recommend the patient obtain a primary care physician and appointment with her gynecologist  As well as a mammogram. We'll check labs today.

## 2015-11-05 ENCOUNTER — Other Ambulatory Visit (HOSPITAL_COMMUNITY): Payer: Self-pay

## 2015-11-05 ENCOUNTER — Other Ambulatory Visit: Payer: BLUE CROSS/BLUE SHIELD

## 2015-11-23 ENCOUNTER — Ambulatory Visit (HOSPITAL_COMMUNITY): Payer: BLUE CROSS/BLUE SHIELD | Attending: Cardiology

## 2015-11-23 ENCOUNTER — Other Ambulatory Visit: Payer: Self-pay

## 2015-11-23 ENCOUNTER — Other Ambulatory Visit (INDEPENDENT_AMBULATORY_CARE_PROVIDER_SITE_OTHER): Payer: BLUE CROSS/BLUE SHIELD | Admitting: *Deleted

## 2015-11-23 DIAGNOSIS — I2510 Atherosclerotic heart disease of native coronary artery without angina pectoris: Secondary | ICD-10-CM | POA: Insufficient documentation

## 2015-11-23 DIAGNOSIS — E785 Hyperlipidemia, unspecified: Secondary | ICD-10-CM | POA: Insufficient documentation

## 2015-11-23 DIAGNOSIS — Z720 Tobacco use: Secondary | ICD-10-CM | POA: Insufficient documentation

## 2015-11-23 DIAGNOSIS — I340 Nonrheumatic mitral (valve) insufficiency: Secondary | ICD-10-CM | POA: Insufficient documentation

## 2015-11-23 DIAGNOSIS — I213 ST elevation (STEMI) myocardial infarction of unspecified site: Secondary | ICD-10-CM | POA: Diagnosis not present

## 2015-11-23 DIAGNOSIS — R29898 Other symptoms and signs involving the musculoskeletal system: Secondary | ICD-10-CM | POA: Insufficient documentation

## 2015-11-23 DIAGNOSIS — I255 Ischemic cardiomyopathy: Secondary | ICD-10-CM | POA: Insufficient documentation

## 2015-11-23 LAB — COMPREHENSIVE METABOLIC PANEL
ALBUMIN: 4.3 g/dL (ref 3.6–5.1)
ALT: 13 U/L (ref 6–29)
AST: 14 U/L (ref 10–35)
Alkaline Phosphatase: 92 U/L (ref 33–115)
BUN: 8 mg/dL (ref 7–25)
CALCIUM: 9.2 mg/dL (ref 8.6–10.2)
CHLORIDE: 104 mmol/L (ref 98–110)
CO2: 30 mmol/L (ref 20–31)
CREATININE: 0.64 mg/dL (ref 0.50–1.10)
Glucose, Bld: 94 mg/dL (ref 65–99)
Potassium: 5 mmol/L (ref 3.5–5.3)
SODIUM: 142 mmol/L (ref 135–146)
Total Bilirubin: 0.3 mg/dL (ref 0.2–1.2)
Total Protein: 6.6 g/dL (ref 6.1–8.1)

## 2015-11-23 LAB — CBC
HCT: 39.6 % (ref 36.0–46.0)
HEMOGLOBIN: 13.2 g/dL (ref 12.0–15.0)
MCH: 28.1 pg (ref 26.0–34.0)
MCHC: 33.3 g/dL (ref 30.0–36.0)
MCV: 84.4 fL (ref 78.0–100.0)
MPV: 8.6 fL (ref 8.6–12.4)
PLATELETS: 397 10*3/uL (ref 150–400)
RBC: 4.69 MIL/uL (ref 3.87–5.11)
RDW: 13.8 % (ref 11.5–15.5)
WBC: 7.2 10*3/uL (ref 4.0–10.5)

## 2015-11-23 LAB — HEPATIC FUNCTION PANEL
ALBUMIN: 4.3 g/dL (ref 3.6–5.1)
ALK PHOS: 92 U/L (ref 33–115)
ALT: 13 U/L (ref 6–29)
AST: 14 U/L (ref 10–35)
Bilirubin, Direct: 0.1 mg/dL (ref <=0.2)
Indirect Bilirubin: 0.2 mg/dL (ref 0.2–1.2)
TOTAL PROTEIN: 6.6 g/dL (ref 6.1–8.1)
Total Bilirubin: 0.3 mg/dL (ref 0.2–1.2)

## 2015-11-23 LAB — LIPID PANEL
CHOL/HDL RATIO: 4.3 ratio (ref <=5.0)
Cholesterol: 159 mg/dL (ref 125–200)
HDL: 37 mg/dL — AB (ref >=46)
LDL Cholesterol: 85 mg/dL (ref <130)
Triglycerides: 185 mg/dL — ABNORMAL HIGH (ref <150)
VLDL: 37 mg/dL — ABNORMAL HIGH (ref <30)

## 2016-02-22 ENCOUNTER — Ambulatory Visit (INDEPENDENT_AMBULATORY_CARE_PROVIDER_SITE_OTHER): Payer: BLUE CROSS/BLUE SHIELD | Admitting: Cardiovascular Disease

## 2016-02-22 ENCOUNTER — Encounter: Payer: Self-pay | Admitting: Cardiovascular Disease

## 2016-02-22 VITALS — BP 104/60 | HR 98 | Ht 63.0 in | Wt 150.4 lb

## 2016-02-22 DIAGNOSIS — I779 Disorder of arteries and arterioles, unspecified: Secondary | ICD-10-CM

## 2016-02-22 DIAGNOSIS — I255 Ischemic cardiomyopathy: Secondary | ICD-10-CM

## 2016-02-22 DIAGNOSIS — E785 Hyperlipidemia, unspecified: Secondary | ICD-10-CM

## 2016-02-22 DIAGNOSIS — I2510 Atherosclerotic heart disease of native coronary artery without angina pectoris: Secondary | ICD-10-CM | POA: Diagnosis not present

## 2016-02-22 DIAGNOSIS — F17201 Nicotine dependence, unspecified, in remission: Secondary | ICD-10-CM

## 2016-02-22 DIAGNOSIS — I739 Peripheral vascular disease, unspecified: Secondary | ICD-10-CM

## 2016-02-22 NOTE — Patient Instructions (Signed)
Medication Instructions:  Your physician recommends that you continue on your current medications as directed. Please refer to the Current Medication list given to you today.   Labwork: None   Testing/Procedures: none  Follow-Up: Your physician wants you to follow-up in: 6 months with Dr Clifton JamesMcAlhany. (December 2017).  You will receive a reminder letter in the mail two months in advance. If you don't receive a letter, please call our office to schedule the follow-up appointment.        If you need a refill on your cardiac medications before your next appointment, please call your pharmacy.

## 2016-02-22 NOTE — Progress Notes (Signed)
Chief Complaint  Patient presents with  . Coronary Artery Disease    History of Present Illness: 45 yo female with history of CAD s/p anterior STEMI 02/2014 s/p DES x 2 to LAD complicated by acute stent thrombosis shortly after requiring repeat PTCA/thrombectomy, ICM EF 40-45%, prior tobacco abuse, dyslipidemia, prior subclavian bypass for stenosis. Echo 11/23/15 with LVEF=40-45%, trivial MR.    She is here today for follow up. She is feeling well. No chest pain or SOB. She has not been exercising. No LE edema.   Primary Care Physician: No PCP Per Patient  Past Medical History  Diagnosis Date  . Tobacco abuse   . Subclavian artery disease (HCC)     a. H/o subclavian artery stenosis s/p carotid/subclavian bypass.  Marland Kitchen. NSVT (nonsustained ventricular tachycardia) (HCC)   . CAD (coronary artery disease)     a. Anterior STEMI 02/2014 s/p PTCA/DESx2 to mLAD, residual moderate LAD disease - recurrent pain/ST elevation with emergent re-cath with acute stent thrombosis s/p PTCA, IVUS, thrombectomy of LAD.   . Ischemic cardiomyopathy     a. 02/2014: EF 40-45%, severe hypokinesis of mid-apical anteroseptal and apical myocardium, severe HK of mid-apical lateral myocardium.  Marland Kitchen. Dyslipidemia     Past Surgical History  Procedure Laterality Date  . Cholecystectomy    . Carotid artery - subclavian artery bypass graft    . Abdominal hysterectomy    . Left heart catheterization with coronary angiogram N/A 03/13/2014    Procedure: LEFT HEART CATHETERIZATION WITH CORONARY ANGIOGRAM;  Surgeon: Kathleene Hazelhristopher D McAlhany, MD;  [facility];  Service: Cardiovascular;  Laterality: N/A;    Current Outpatient Prescriptions  Medication Sig Dispense Refill  . aspirin 81 MG chewable tablet Chew 1 tablet (81 mg total) by mouth daily.    . cyclobenzaprine (FLEXERIL) 5 MG tablet Take 5 mg by mouth 3 (three) times daily as needed for muscle spasms.    Marland Kitchen. lisinopril (PRINIVIL,ZESTRIL) 2.5 MG tablet Take 1 tablet  (2.5 mg total) by mouth daily. 90 tablet 1  . nitroGLYCERIN (NITROSTAT) 0.4 MG SL tablet Place 1 tablet (0.4 mg total) under the tongue every 5 (five) minutes as needed for chest pain. 25 tablet 3  . oxyCODONE-acetaminophen (PERCOCET/ROXICET) 5-325 MG per tablet Take 2 tablets by mouth 2 (two) times daily. Two daily by mouth for pain    . prasugrel (EFFIENT) 10 MG TABS tablet Take 1 tablet (10 mg total) by mouth daily. 90 tablet 1  . pravastatin (PRAVACHOL) 40 MG tablet Take 1 tablet (40 mg total) by mouth every evening. 30 tablet 11  . TOPROL XL 50 MG 24 hr tablet Take 50 mg by mouth daily.      No current facility-administered medications for this visit.    No Known Allergies  Social History   Social History  . Marital Status: Married    Spouse Name: N/A  . Number of Children: N/A  . Years of Education: N/A   Occupational History  . Not on file.   Social History Main Topics  . Smoking status: Former Smoker -- 1.00 packs/day for 25 years    Types: Cigarettes    Quit date: 03/13/2014  . Smokeless tobacco: Current User     Comment: vapor   . Alcohol Use: No  . Drug Use: No  . Sexual Activity: Yes    Birth Control/ Protection: None   Other Topics Concern  . Not on file   Social History Narrative    Family History  Problem Relation Age of Onset  . CAD Father   . Heart attack Mother   . Heart attack Maternal Grandfather   . Stroke Maternal Grandmother   . Hypertension Mother   . Hypertension Maternal Grandfather   . Hypertension Maternal Grandmother     Review of Systems:  As stated in the HPI and otherwise negative.   BP 104/60 mmHg  Pulse 98  Ht  (1.6 m)  Wt 150 lb 6.4 oz (68.221 kg)  BMI 26.65 kg/m2  Physical Examination: General: Well developed, well nourished, NAD HEENT: OP clear, mucus membranes moist SKIN: warm, dry. No rashes. Neuro: No focal deficits Musculoskeletal: Muscle strength 5/5 all ext Psychiatric: Mood and affect normal Neck: No  JVD, no carotid bruits, no thyromegaly, no lymphadenopathy. Lungs:Clear bilaterally, no wheezes, rhonci, crackles Cardiovascular: Regular rate and rhythm. No murmurs, gallops or rubs. Abdomen:Soft. Bowel sounds present. Non-tender.  Extremities: No lower extremity edema. Pulses are 2 + in the bilateral DP/PT.  Echo 11/23/15: Left ventricle: The cavity size was normal. Systolic function was  mildly to moderately reduced. The estimated ejection fraction was  in the range of 40% to 45%. There is akinesis of the  mid-apicalinferior and inferoseptal myocardium. There is akinesis  of the mid-apicalanteroseptal myocardium. There is severe  hypokinesis of the lateral myocardium. Features are consistent  with a pseudonormal left ventricular filling pattern, with  concomitant abnormal relaxation and increased filling pressure  (grade 2 diastolic dysfunction). - Mitral valve: There was trivial regurgitation.  Cardiac cath 03/13/14:  Left main: 30% distal stenosis.  Left Anterior Descending Artery: Large caliber vessel that courses to the apex. The proximal vessel has diffuse 30% stenosis. There is a moderate caliber diagonal branch with ostial 50% stenosis. The mid LAD is totally occluded after the diagonal branch.  Circumflex Artery: Small to moderate caliber vessel with small Obtuse marginal branch. 40% mid stenosis.  Right Coronary Artery: Large dominant vessel with 50% proximal stenosis. 30% mid stenosis. Left Ventricular Angiogram: LVEF=40% with hypokinesis of the anterior wall and apex.   EKG:  EKG is not ordered today. The ekg ordered today demonstrates   Recent Labs: 11/23/2015: ALT 13; ALT 13; BUN 8; Creat 0.64; Hemoglobin 13.2; Platelets 397; Potassium 5.0; Sodium 142   Lipid Panel    Component Value Date/Time   CHOL 159 11/23/2015 1011   TRIG 185* 11/23/2015 1011   HDL 37* 11/23/2015 1011   CHOLHDL 4.3 11/23/2015 1011   VLDL 37* 11/23/2015 1011   LDLCALC 85 11/23/2015  1011   LDLDIRECT 134.0 04/04/2015 1049     Wt Readings from Last 3 Encounters:  02/22/16 150 lb 6.4 oz (68.221 kg)  10/22/15 162 lb 12.8 oz (73.846 kg)  04/02/15 158 lb (71.668 kg)     Other studies Reviewed: Additional studies/ records that were reviewed today include: . Review of the above records demonstrates:    Assessment and Plan:   1. CAD: She seems to be doing well. No recent chest pain worrisome for angina. She is known to have CAD with anterior STEMI 02/2014 s/p DES x 2 to LAD complicated by acute stent thrombosis s/p PTCA/thrombectomy. Will continue aspirin, statin, beta blocker, Effient  2. Ischemic cardiomyopathy: LVEF 40-45% without CHF symptoms. Continue lisinopril and beta blocker.   3. HLD: She is tolerating Pravastatin. LDL near goal.    4. Subclavian artery stenosis: No symptoms  5. Tobacco abuse in remission: She has stopped smoking.   Current medicines are reviewed at length with the  patient today.  The patient does not have concerns regarding medicines.  The following changes have been made:  no change  Labs/ tests ordered today include:  No orders of the defined types were placed in this encounter.    Disposition:   FU with me in 6 months  Signed, Verne Carrowhristopher McAlhany, MD 02/22/2016 11:50 AM    [facility] [address], [place], [place]  [address] Phone: [phone]; Fax: [phone]

## 2016-03-30 ENCOUNTER — Other Ambulatory Visit: Payer: Self-pay | Admitting: Cardiovascular Disease

## 2016-04-13 ENCOUNTER — Other Ambulatory Visit: Payer: Self-pay | Admitting: Cardiovascular Disease

## 2016-05-05 IMAGING — CR DG CHEST 2V
2 series · 2 of 2 positions shown · non-contrast
Comparison: PA and lateral chest of November 15, 2008

CLINICAL DATA: Four month history of right arm and shoulder pain
radiating into the chest and right aspect of the neck beginning
today ; history of shoulder injury in February 2014 ; history of
myocardial infarction on March 13, 2014 with subsequent stent
placement; history of hypertension and previous tobacco use until
March 13, 2014.

EXAM:
CHEST  2 VIEW

[w chest pa]
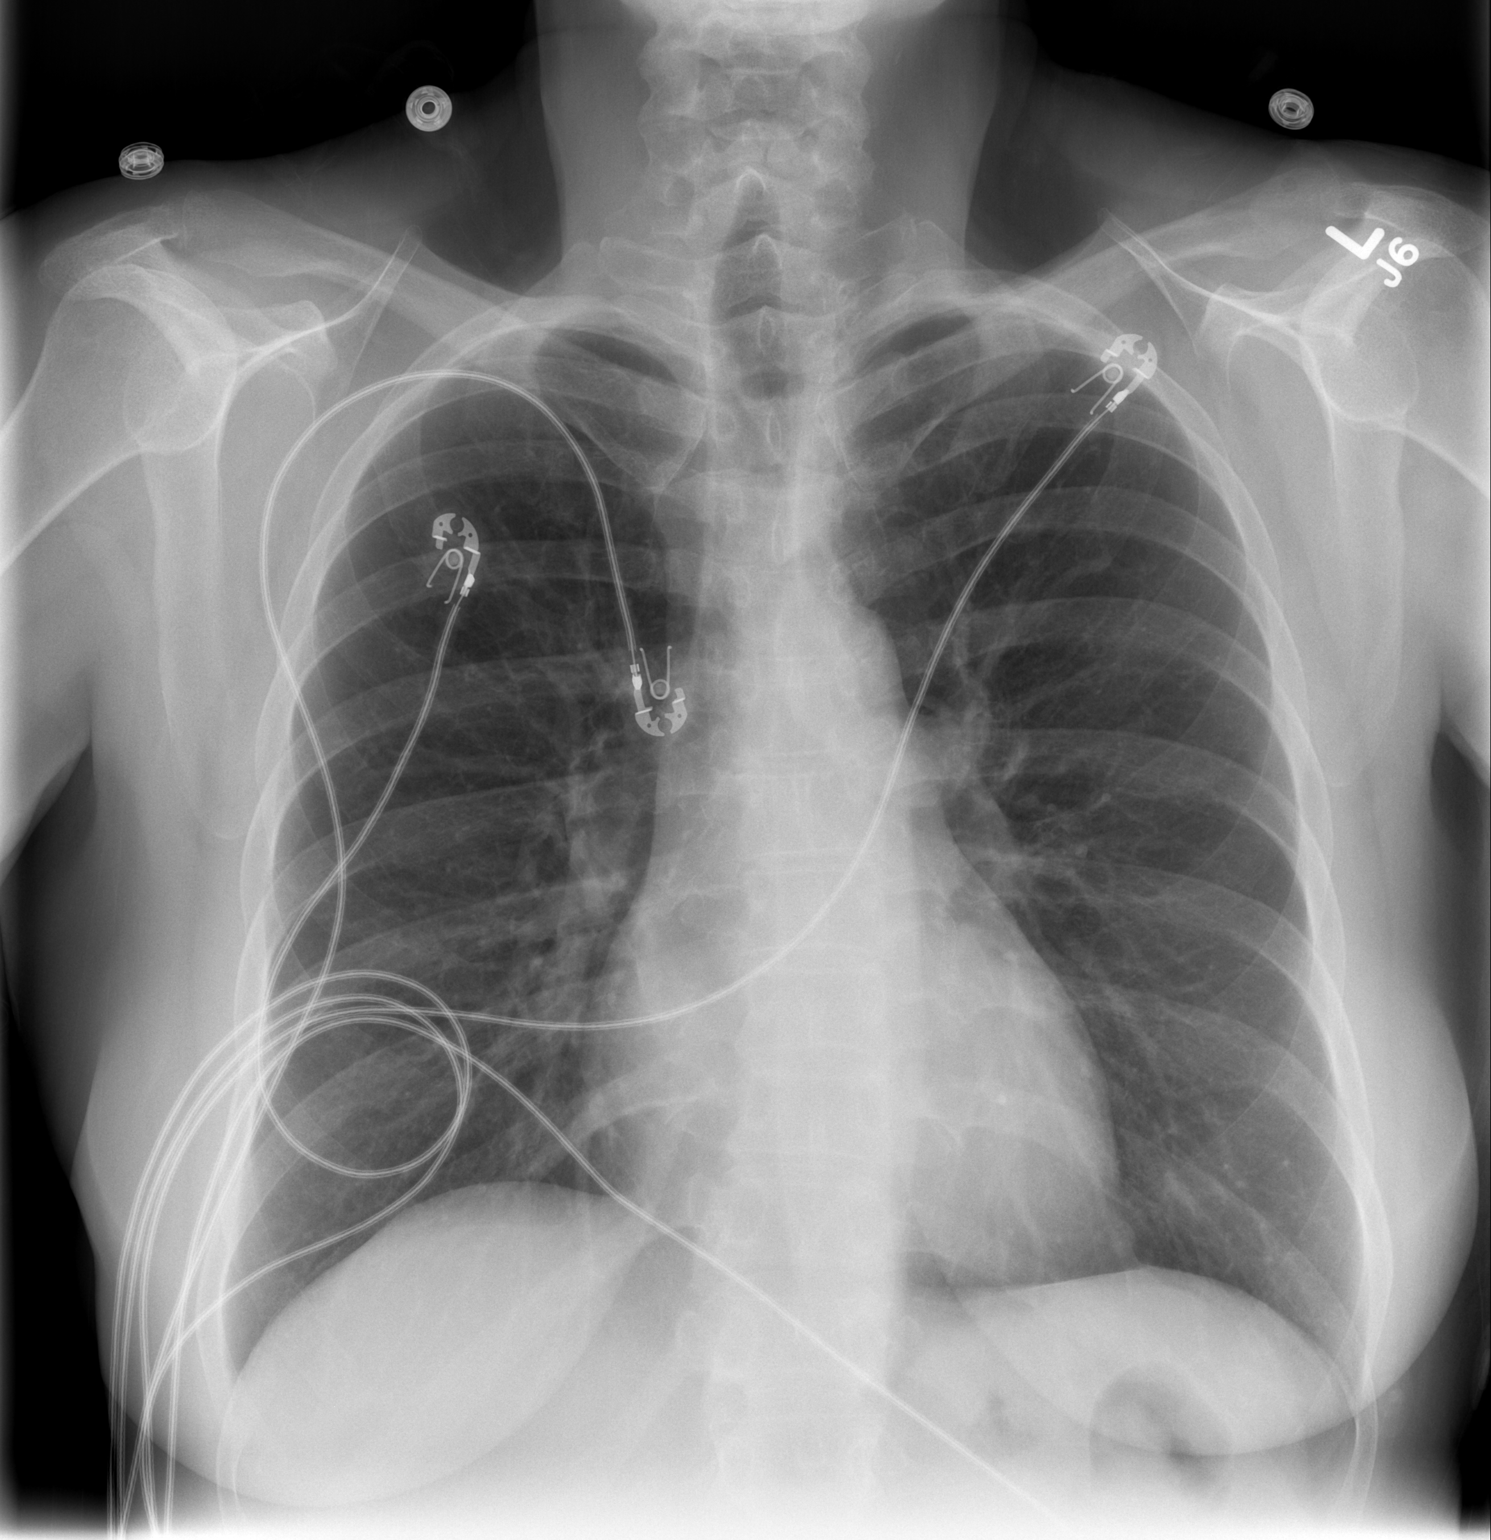

[w chest lat]
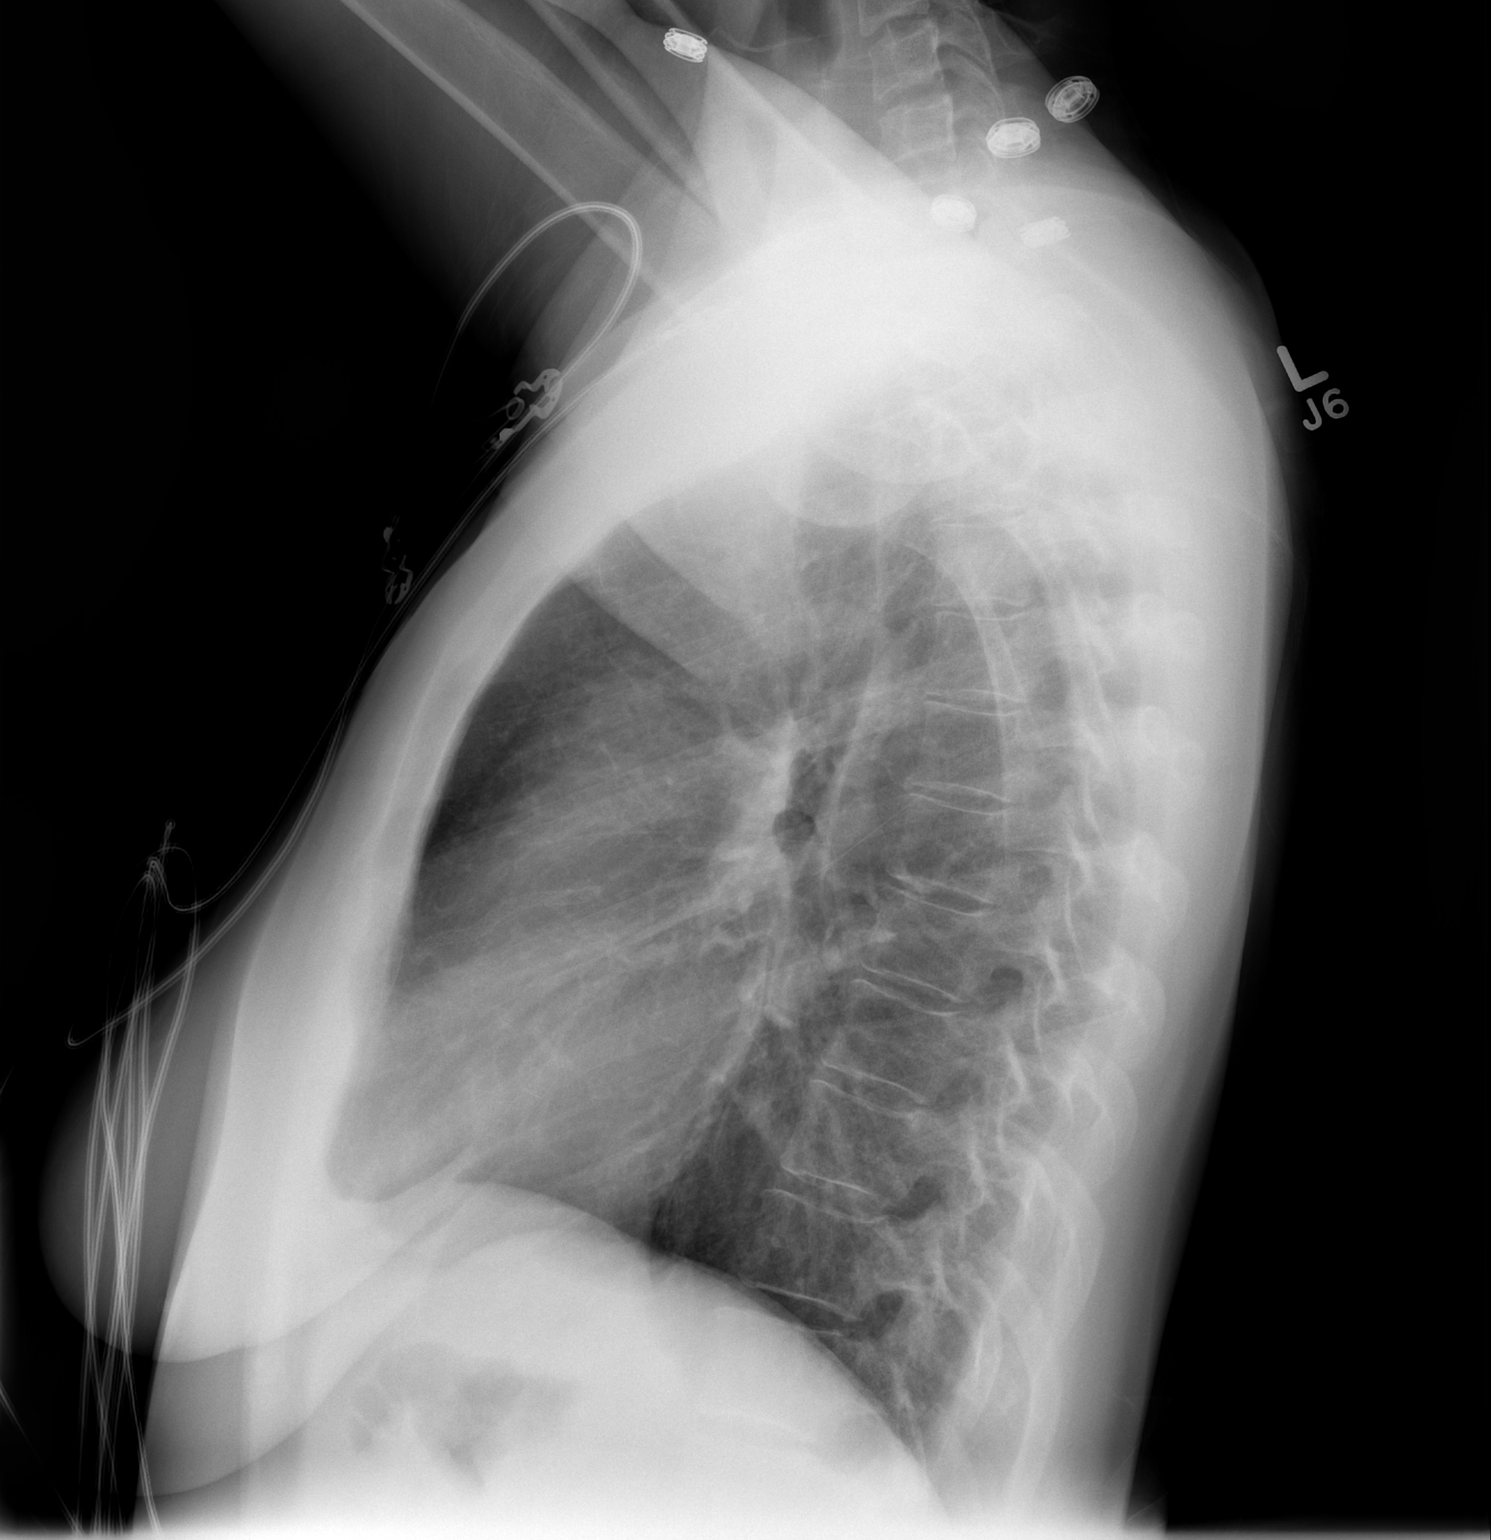

[2 of 2 positions shown; findings below may reference images not displayed]

FINDINGS: The lungs are borderline hyperinflated. The heart and pulmonary
vascularity are normal. The mediastinum is normal in width. There is
no pleural effusion. The observed portions of the bony thorax
exhibit no acute abnormalities. The shoulders are unremarkable where
visualized.
IMPRESSION: There is no active cardiopulmonary disease. Mild hyperinflation may
be voluntary or could reflect the patient's former chronic tobacco
use.

## 2016-05-22 ENCOUNTER — Other Ambulatory Visit: Payer: Self-pay | Admitting: Cardiovascular Disease

## 2016-08-07 ENCOUNTER — Encounter: Payer: Self-pay | Admitting: Physician Assistant

## 2016-08-19 ENCOUNTER — Ambulatory Visit (INDEPENDENT_AMBULATORY_CARE_PROVIDER_SITE_OTHER): Payer: BLUE CROSS/BLUE SHIELD | Admitting: Physician Assistant

## 2016-08-19 ENCOUNTER — Encounter: Payer: Self-pay | Admitting: Physician Assistant

## 2016-08-19 VITALS — BP 116/70 | HR 88 | Ht 63.0 in | Wt 143.1 lb

## 2016-08-19 DIAGNOSIS — E785 Hyperlipidemia, unspecified: Secondary | ICD-10-CM

## 2016-08-19 DIAGNOSIS — I779 Disorder of arteries and arterioles, unspecified: Secondary | ICD-10-CM

## 2016-08-19 DIAGNOSIS — I2510 Atherosclerotic heart disease of native coronary artery without angina pectoris: Secondary | ICD-10-CM | POA: Diagnosis not present

## 2016-08-19 DIAGNOSIS — I739 Peripheral vascular disease, unspecified: Secondary | ICD-10-CM

## 2016-08-19 DIAGNOSIS — I255 Ischemic cardiomyopathy: Secondary | ICD-10-CM | POA: Diagnosis not present

## 2016-08-19 DIAGNOSIS — Z87891 Personal history of nicotine dependence: Secondary | ICD-10-CM

## 2016-08-19 NOTE — Patient Instructions (Signed)
Medication Instructions:  Your physician recommends that you continue on your current medications as directed. Please refer to the Current Medication list given to you today.   Labwork: AT YOUR CONVENIENCE, PLEASE COME BACK FOR FASTING LABS WITHIN 1-2 WEEKS  Testing/Procedures: None ordered  Follow-Up: Your physician wants you to follow-up in: 6 MONTHS WITH DR. Clifton JamesMCALHANY   You will receive a reminder letter in the mail two months in advance. If you don't receive a letter, please call our office to schedule the follow-up appointment.   Any Other Special Instructions Will Be Listed Below (If Applicable).     If you need a refill on your cardiac medications before your next appointment, please call your pharmacy.

## 2016-08-19 NOTE — Progress Notes (Addendum)
Cardiology Office Note    Date:  08/19/2016  ID:  Shannon [name], DOB [birth year], MRN [id number] PCP:  No PCP Per Patient  Cardiologist: Dr. Clifton James   Chief Complaint: f/u CAD  History of Present Illness:  Shannon [name] is a 45 y.o. female with history of anterior STEMI 02/2014 s/p DESx2 to LAD c/b acute stent thrombosis shortly after requiring repeat PTCA/thrombectomy, ICM EF 40-45%, Horner's syndrome (with chronically unequal pupils), anxiety, prior tobacco abuse, dyslipidemia (intolerance to Lipitor), prior subclavian bypass for stenosis, musculoskeletal shoulder pain who presents for routine follow-up. Last echo 10/2015: EF 40-45% (slightly improved), multiple WMA, grade 2 DD. Labs 10/2015: normal CMET with K 5.0, Cr 0.64, LFTs wnl, CBC wnl, LDL 85 (on Pravastatin).   She has been doing well. She started exercising without functional limitation. She denies any recurrent chest pain or dyspnea. No palpitations, LEE, syncope, weight gain. She has noticed 2 episodes of orthopnea in 6 months that were short-lived. She is not sure if this was related to the significant anxiety she has. She has not had any issues with the pravastatin.   Past Medical History:  Diagnosis Date  . Anxiety   . CAD (coronary artery disease)    a. Anterior STEMI 02/2014 s/p PTCA/DESx2 to mLAD, residual moderate LAD disease - recurrent pain/ST elevation with emergent re-cath with acute stent thrombosis s/p PTCA, IVUS, thrombectomy of LAD.   Marland Kitchen Dyslipidemia   . Former tobacco use   . Horner's syndrome   . Ischemic cardiomyopathy    a. 02/2014: EF 40-45%, severe hypokinesis of mid-apical anteroseptal and apical myocardium, severe HK of mid-apical lateral myocardium.  Marland Kitchen NSVT (nonsustained ventricular tachycardia) (HCC)   . Subclavian artery disease (HCC)    a. H/o subclavian artery stenosis s/p carotid/subclavian bypass.    Past Surgical History:  Procedure Laterality Date  . ABDOMINAL HYSTERECTOMY    . CAROTID  ARTERY - SUBCLAVIAN ARTERY BYPASS GRAFT    . CHOLECYSTECTOMY    . LEFT HEART CATHETERIZATION WITH CORONARY ANGIOGRAM N/A 03/13/2014   Procedure: LEFT HEART CATHETERIZATION WITH CORONARY ANGIOGRAM;  Surgeon: Kathleene Hazel, MD;  Location: [facility] CATH LAB;  Service: Cardiovascular;  Laterality: N/A;    Current Medications: Current Outpatient Prescriptions  Medication Sig Dispense Refill  . aspirin 81 MG chewable tablet Chew 1 tablet (81 mg total) by mouth daily.    . cyclobenzaprine (FLEXERIL) 10 MG tablet Take 10 mg by mouth daily.    Marland Kitchen lisinopril (PRINIVIL,ZESTRIL) 2.5 MG tablet Take 1 tablet (2.5 mg total) by mouth daily. 90 tablet 2  . metoprolol succinate (TOPROL-XL) 50 MG 24 hr tablet TAKE 1 TABLET DAILY WITH OR IMMEDIATELY AFTER A MEAL 90 tablet 3  . nitroGLYCERIN (NITROSTAT) 0.4 MG SL tablet Place 1 tablet (0.4 mg total) under the tongue every 5 (five) minutes as needed for chest pain. 25 tablet 3  . oxyCODONE-acetaminophen (PERCOCET/ROXICET) 5-325 MG per tablet Take 1 tablet by mouth 2 (two) times daily. Two daily by mouth for pain    . prasugrel (EFFIENT) 10 MG TABS tablet Take 1 tablet (10 mg total) by mouth daily. 90 tablet 2  . pravastatin (PRAVACHOL) 40 MG tablet TAKE 1 TABLET BY MOUTH EVERY EVENING 30 tablet 10   No current facility-administered medications for this visit.      Allergies:   Lipitor [atorvastatin]   Social History   Social History  . Marital status: Married    Spouse name: N/A  . Number of children: N/A  .  Years of education: N/A   Social History Main Topics  . Smoking status: Former Smoker    Packs/day: 1.00    Years: 25.00    Types: Cigarettes    Quit date: 03/13/2014  . Smokeless tobacco: Current User     Comment: vapor   . Alcohol use No  . Drug use: No  . Sexual activity: Yes    Birth control/ protection: None   Other Topics Concern  . None   Social History Narrative  . None     Family History:  The patient's family history  includes CAD in her father; Heart attack in her maternal grandfather and mother; Hypertension in her maternal grandfather, maternal grandmother, and mother; Stroke in her maternal grandmother.   ROS:   Please see the history of present illness.  All other systems are reviewed and otherwise negative.    PHYSICAL EXAM:   VS:  BP 116/70   Pulse 88   Ht 5\' 3"  (1.6 m)   Wt 143 lb 1.9 oz (64.9 kg)   SpO2 98%   BMI 25.35 kg/m   BMI: Body mass index is 25.35 kg/m. GEN: Well nourished, well developed WF, in no acute distress  HEENT: normocephalic, atraumatic Neck: no JVD, carotid bruits, or masses Cardiac: RRR; no murmurs, rubs, or gallops, no edema, no subclavian bruits Respiratory:  clear to auscultation bilaterally, normal work of breathing GI: soft, nontender, nondistended, + BS MS: no deformity or atrophy  Skin: warm and dry, no rash Neuro:  Alert and Oriented x 3, Strength and sensation are intact, follows commands, unequal pupil size chronically 2/2 Horner's syndrome (L pupil smaller than R) Psych: euthymic mood, full affect  Wt Readings from Last 3 Encounters:  08/19/16 143 lb 1.9 oz (64.9 kg)  02/22/16 150 lb 6.4 oz (68.2 kg)  10/22/15 162 lb 12.8 oz (73.8 kg)      Studies/Labs Reviewed:   EKG:  EKG was ordered today and personally reviewed by me and demonstrates NSR 80bpm, TWI I, avL c/w prior.  Recent Labs: 11/23/2015: ALT 13; ALT 13; BUN 8; Creat 0.64; Hemoglobin 13.2; Platelets 397; Potassium 5.0; Sodium 142   Lipid Panel    Component Value Date/Time   CHOL 159 11/23/2015 1011   TRIG 185 (H) 11/23/2015 1011   HDL 37 (L) 11/23/2015 1011   CHOLHDL 4.3 11/23/2015 1011   VLDL 37 (H) 11/23/2015 1011   LDLCALC 85 11/23/2015 1011   LDLDIRECT 134.0 04/04/2015 1049    Additional studies/ records that were reviewed today include: Summarized above.    ASSESSMENT & PLAN:   1. CAD - doing well 2 years+ out from MI. Continue ASA, BB, statin, Effient. Will send message  to Dr. Clifton JamesMcAlhany to clarify long-term plan for Effient. 2. Ischemic cardiomyopathy - continue BB, ACEi. BP tends to run low-normal so will not make any med changes today. She does not have any clinical signs of HF on exam. If she has recurrent orthopnea I have asked her to let us know. 3. Dyslipidemia - recheck labs at her next fasting convenience. If LDL remains below goal would consider increasing pravastatin versus trial of rosuvastatin. 4. Subclavian artery disease - no symptoms related to this. Continue statin. 5. Former tobacco abuse - congratulated patient on abstinence. She does continue to vape occasionally. I recommended the end goal be to quit completely.  Disposition: F/u with Dr. Clifton JamesMcAlhany in 6 months. The patient had a lot of questions that I tried my best to answer (i.e.  advised against hot tubs and tanning beds). She states she is contemplating getting dental implants. She will discuss further with her dentist about what the procedure entails to determine what pre-op clearance/med adjustment is needed. She also reports h/o significant anxiety and I advised she obtain a PCP to further help manage.   Medication Adjustments/Labs and Tests Ordered: Current medicines are reviewed at length with the patient today.  Concerns regarding medicines are outlined above. Medication changes, Labs and Tests ordered today are summarized above and listed in the Patient Instructions accessible in Encounters.   Thomasene MohairSigned, Ka Flammer PA-C  08/19/2016 4:23 PM    [facility] [address], [place], [place]  [address] Phone: [phone]; Fax: [phone](336) [phone]

## 2016-08-21 ENCOUNTER — Telehealth: Payer: Self-pay | Admitting: Physician Assistant

## 2016-08-21 NOTE — Telephone Encounter (Signed)
Please let patient know I spoke with Dr. Clifton JamesMcAlhany about duration of her Effient (since some patients have this stopped after a year of treatment and others are continued indefinitely). The patient had told me she would be very nervous about going off this. Given her history, Dr. Clifton JamesMcAlhany is fine with her continuing both aspirin and Effient at this time.  Also for documentation sake (do not need to relay to patient as this is for my own notes), he does not think any additional imaging needed for subclavian at this time. Majel Giel PA-C

## 2016-08-22 NOTE — Telephone Encounter (Signed)
Called pt, per Ronie Spiesayna Dunn, PA-C re: message below, and left pt a message to call back.

## 2016-10-29 ENCOUNTER — Other Ambulatory Visit: Payer: BLUE CROSS/BLUE SHIELD | Admitting: *Deleted

## 2016-10-29 DIAGNOSIS — I2510 Atherosclerotic heart disease of native coronary artery without angina pectoris: Secondary | ICD-10-CM

## 2016-10-29 DIAGNOSIS — I2102 ST elevation (STEMI) myocardial infarction involving left anterior descending coronary artery: Secondary | ICD-10-CM

## 2016-10-29 DIAGNOSIS — E785 Hyperlipidemia, unspecified: Secondary | ICD-10-CM

## 2016-10-29 LAB — COMPREHENSIVE METABOLIC PANEL
A/G RATIO: 1.8 (ref 1.2–2.2)
ALT: 14 IU/L (ref 0–32)
AST: 16 IU/L (ref 0–40)
Albumin: 4.6 g/dL (ref 3.5–5.5)
Alkaline Phosphatase: 84 IU/L (ref 39–117)
BILIRUBIN TOTAL: 0.5 mg/dL (ref 0.0–1.2)
BUN/Creatinine Ratio: 14 (ref 9–23)
BUN: 8 mg/dL (ref 6–24)
CHLORIDE: 101 mmol/L (ref 96–106)
CO2: 25 mmol/L (ref 18–29)
Calcium: 9.5 mg/dL (ref 8.7–10.2)
Creatinine, Ser: 0.57 mg/dL (ref 0.57–1.00)
GFR calc Af Amer: 129 mL/min/{1.73_m2} (ref >59)
GFR calc non Af Amer: 112 mL/min/{1.73_m2} (ref >59)
Globulin, Total: 2.5 g/dL (ref 1.5–4.5)
Glucose: 94 mg/dL (ref 65–99)
POTASSIUM: 4.9 mmol/L (ref 3.5–5.2)
Sodium: 141 mmol/L (ref 134–144)
Total Protein: 7.1 g/dL (ref 6.0–8.5)

## 2016-10-29 LAB — CBC WITH DIFFERENTIAL/PLATELET
Basophils Absolute: 0 10*3/uL (ref 0.0–0.2)
Basos: 1 %
EOS (ABSOLUTE): 0.3 10*3/uL (ref 0.0–0.4)
EOS: 4 %
HEMATOCRIT: 38.2 % (ref 34.0–46.6)
HEMOGLOBIN: 12.8 g/dL (ref 11.1–15.9)
IMMATURE GRANS (ABS): 0 10*3/uL (ref 0.0–0.1)
Immature Granulocytes: 0 %
LYMPHS ABS: 2.2 10*3/uL (ref 0.7–3.1)
LYMPHS: 36 %
MCH: 28.4 pg (ref 26.6–33.0)
MCHC: 33.5 g/dL (ref 31.5–35.7)
MCV: 85 fL (ref 79–97)
MONOCYTES: 8 %
Monocytes Absolute: 0.5 10*3/uL (ref 0.1–0.9)
NEUTROS ABS: 3.1 10*3/uL (ref 1.4–7.0)
Neutrophils: 51 %
Platelets: 343 10*3/uL (ref 150–379)
RBC: 4.5 x10E6/uL (ref 3.77–5.28)
RDW: 13.4 % (ref 12.3–15.4)
WBC: 6 10*3/uL (ref 3.4–10.8)

## 2016-10-29 LAB — LIPID PANEL
Chol/HDL Ratio: 3.4 ratio units (ref 0.0–4.4)
Cholesterol, Total: 161 mg/dL (ref 100–199)
HDL: 47 mg/dL (ref >39)
LDL Calculated: 85 mg/dL (ref 0–99)
TRIGLYCERIDES: 147 mg/dL (ref 0–149)
VLDL Cholesterol Cal: 29 mg/dL (ref 5–40)

## 2016-10-29 NOTE — Addendum Note (Signed)
Addended by: Tonita PhoenixBOWDEN, ROBIN K on: 10/29/2016 11:30 AM   Modules accepted: Orders

## 2017-01-10 ENCOUNTER — Other Ambulatory Visit: Payer: Self-pay | Admitting: Cardiovascular Disease

## 2017-02-15 ENCOUNTER — Encounter: Payer: Self-pay | Admitting: Cardiovascular Disease

## 2017-02-15 NOTE — Progress Notes (Signed)
Chief Complaint  Patient presents with  . Follow-up    CAD    History of Present Illness: 46 yo female with history of CAD, ischemic cardiomyopathy, HLD, prior tobacco abuse, PAD and Horner's syndrome who is here today for cardiac follow up. She had an anterior STEMI in July 2015. The occluded LAD was treated with 2 drug eluting stents. She had acute stent thrombosis requiring thrombectomy. She was also noted to have mild left main stenosis, mild Circumflex stenosis and moderate proximal RCA stenosis. She has done well since then. Last echo March 2017 with LVEF=40-45%, trivial MR. She has PAD and has had prior carotid/subclavian artery bypass.     She is here today for follow up. The patient denies any chest pain, dyspnea, palpitations, lower extremity edema, orthopnea, PND, dizziness, near syncope or syncope.    Primary Care Physician: Patient, No Pcp Per  Past Medical History:  Diagnosis Date  . Anxiety   . CAD (coronary artery disease)    a. Anterior STEMI 02/2014 s/p PTCA/DESx2 to mLAD, residual moderate LAD disease - recurrent pain/ST elevation with emergent re-cath with acute stent thrombosis s/p PTCA, IVUS, thrombectomy of LAD.   Marland Kitchen Dyslipidemia   . Former tobacco use   . Horner's syndrome   . Ischemic cardiomyopathy   . NSVT (nonsustained ventricular tachycardia) (HCC)   . Subclavian artery disease (HCC)    a. H/o subclavian artery stenosis s/p carotid/subclavian bypass.    Past Surgical History:  Procedure Laterality Date  . ABDOMINAL HYSTERECTOMY    . CAROTID ARTERY - SUBCLAVIAN ARTERY BYPASS GRAFT    . CHOLECYSTECTOMY    . LEFT HEART CATHETERIZATION WITH CORONARY ANGIOGRAM N/A 03/13/2014   Procedure: LEFT HEART CATHETERIZATION WITH CORONARY ANGIOGRAM;  Surgeon: Kathleene Hazel, MD;  Location: [facility] CATH LAB;  Service: Cardiovascular;  Laterality: N/A;    Current Outpatient Prescriptions  Medication Sig Dispense Refill  . aspirin 81 MG chewable tablet Chew 1  tablet (81 mg total) by mouth daily.    . cyclobenzaprine (FLEXERIL) 10 MG tablet Take 10 mg by mouth daily.    . Lactobacillus-Inulin (PROBIOTIC DIGESTIVE SUPPORT PO) Take 2 capsules by mouth daily.    Marland Kitchen lisinopril (PRINIVIL,ZESTRIL) 2.5 MG tablet TAKE 1 TABLET DAILY 90 tablet 1  . metoprolol succinate (TOPROL-XL) 50 MG 24 hr tablet TAKE 1 TABLET DAILY WITH OR IMMEDIATELY AFTER A MEAL 90 tablet 3  . nitroGLYCERIN (NITROSTAT) 0.4 MG SL tablet Place 1 tablet (0.4 mg total) under the tongue every 5 (five) minutes as needed for chest pain. 75 tablet 3  . oxyCODONE-acetaminophen (PERCOCET/ROXICET) 5-325 MG per tablet Take 1 tablet by mouth 2 (two) times daily. Two daily by mouth for pain    . prasugrel (EFFIENT) 10 MG TABS tablet TAKE 1 TABLET DAILY 90 tablet 1  . pravastatin (PRAVACHOL) 40 MG tablet TAKE 1 TABLET BY MOUTH EVERY EVENING 30 tablet 10   No current facility-administered medications for this visit.     Allergies  Allergen Reactions  . Lipitor [Atorvastatin]     Makes her feel sick - nauseated    Social History   Social History  . Marital status: Married    Spouse name: N/A  . Number of children: N/A  . Years of education: N/A   Occupational History  . Not on file.   Social History Main Topics  . Smoking status: Former Smoker    Packs/day: 1.00    Years: 25.00    Types: Cigarettes  Quit date: 03/13/2014  . Smokeless tobacco: Current User     Comment: vapor   . Alcohol use No  . Drug use: No  . Sexual activity: Yes    Birth control/ protection: None   Other Topics Concern  . Not on file   Social History Narrative  . No narrative on file    Family History  Problem Relation Age of Onset  . Heart attack Mother   . Hypertension Mother   . CAD Father   . Heart attack Maternal Grandfather   . Hypertension Maternal Grandfather   . Stroke Maternal Grandmother   . Hypertension Maternal Grandmother     Review of Systems:  As stated in the HPI and otherwise  negative.   BP 114/72   Pulse 64   Ht 5\' 3"  (1.6 m)   Wt 140 lb (63.5 kg)   BMI 24.80 kg/m   Physical Examination:  General: Well developed, well nourished, NAD  HEENT: OP clear, mucus membranes moist  SKIN: warm, dry. No rashes. Neuro: No focal deficits  Musculoskeletal: Muscle strength 5/5 all ext  Psychiatric: Mood and affect normal  Neck: No JVD, no carotid bruits, no thyromegaly, no lymphadenopathy.  Lungs:Clear bilaterally, no wheezes, rhonci, crackles Cardiovascular: Regular rate and rhythm. No murmurs, gallops or rubs. Abdomen:Soft. Bowel sounds present. Non-tender.  Extremities: No lower extremity edema. Pulses are 2 + in the bilateral DP/PT.  Echo 11/23/15: Left ventricle: The cavity size was normal. Systolic function was  mildly to moderately reduced. The estimated ejection fraction was  in the range of 40% to 45%. There is akinesis of the  mid-apicalinferior and inferoseptal myocardium. There is akinesis  of the mid-apicalanteroseptal myocardium. There is severe  hypokinesis of the lateral myocardium. Features are consistent  with a pseudonormal left ventricular filling pattern, with  concomitant abnormal relaxation and increased filling pressure  (grade 2 diastolic dysfunction). - Mitral valve: There was trivial regurgitation.  Cardiac cath 03/13/14:  Left main: 30% distal stenosis.  Left Anterior Descending Artery: Large caliber vessel that courses to the apex. The proximal vessel has diffuse 30% stenosis. There is a moderate caliber diagonal branch with ostial 50% stenosis. The mid LAD is totally occluded after the diagonal branch.  Circumflex Artery: Small to moderate caliber vessel with small Obtuse marginal branch. 40% mid stenosis.  Right Coronary Artery: Large dominant vessel with 50% proximal stenosis. 30% mid stenosis. Left Ventricular Angiogram: LVEF=40% with hypokinesis of the anterior wall and apex.   EKG:  EKG is not  ordered today. The  ekg ordered today demonstrates   Recent Labs: 10/29/2016: ALT 14; BUN 8; Creatinine, Ser 0.57; Hemoglobin 12.8; Platelets 343; Potassium 4.9; Sodium 141   Lipid Panel    Component Value Date/Time   CHOL 161 10/29/2016 1130   TRIG 147 10/29/2016 1130   HDL 47 10/29/2016 1130   CHOLHDL 3.4 10/29/2016 1130   CHOLHDL 4.3 11/23/2015 1011   VLDL 37 (H) 11/23/2015 1011   LDLCALC 85 10/29/2016 1130   LDLDIRECT 134.0 04/04/2015 1049     Wt Readings from Last 3 Encounters:  02/16/17 140 lb (63.5 kg)  08/19/16 143 lb 1.9 oz (64.9 kg)  02/22/16 150 lb 6.4 oz (68.2 kg)     Other studies Reviewed: Additional studies/ records that were reviewed today include: . Review of the above records demonstrates:    Assessment and Plan:   1. CAD without angina: She is not having chest pain suggestive of angina. She had an anterior STEMI  in 2015 with occluded LAD treated with 2 DES and complicated by stent thrombosis. She is on ASA/Effient. Will continue DAPT for now. Her stent thrombosis occurred within an hour after her stents were placed due to heavy thrombus burden. Her stents should be well healed by now. She could hold Effient if needed for an elective procedure. Continue statin and beta blocker.   2. Ischemic cardiomyopathy: LVEF=40-45% by echo 2017. Will continue Ace-inh and beta blocker.    3. HLD: Continue statin. LDL near goal.   4. Subclavian artery stenosis: No symptoms  5. Tobacco abuse in remission: She stopped smoking.   Current medicines are reviewed at length with the patient today.  The patient does not have concerns regarding medicines.  The following changes have been made:  no change  Labs/ tests ordered today include:  No orders of the defined types were placed in this encounter.   Disposition:   FU with me in 6 months  Signed, Verne Carrowhristopher Bill Yohn, MD 02/16/2017 11:26 AM    [facility] [address], [place], [place]  [address] Phone: [phone](336)  [phone]; Fax: [phone](336) [phone]

## 2017-02-16 ENCOUNTER — Ambulatory Visit (INDEPENDENT_AMBULATORY_CARE_PROVIDER_SITE_OTHER): Payer: BLUE CROSS/BLUE SHIELD | Admitting: Cardiovascular Disease

## 2017-02-16 VITALS — BP 114/72 | HR 64 | Ht 63.0 in | Wt 140.0 lb

## 2017-02-16 DIAGNOSIS — I255 Ischemic cardiomyopathy: Secondary | ICD-10-CM | POA: Diagnosis not present

## 2017-02-16 DIAGNOSIS — E785 Hyperlipidemia, unspecified: Secondary | ICD-10-CM

## 2017-02-16 DIAGNOSIS — I739 Peripheral vascular disease, unspecified: Secondary | ICD-10-CM

## 2017-02-16 DIAGNOSIS — I779 Disorder of arteries and arterioles, unspecified: Secondary | ICD-10-CM

## 2017-02-16 DIAGNOSIS — I2510 Atherosclerotic heart disease of native coronary artery without angina pectoris: Secondary | ICD-10-CM

## 2017-02-16 MED ORDER — NITROGLYCERIN 0.4 MG SL SUBL: 0.4000 mg | SUBLINGUAL_TABLET | SUBLINGUAL | 3 refills | Status: DC | PRN

## 2017-02-16 NOTE — Patient Instructions (Signed)

## 2017-02-19 ENCOUNTER — Other Ambulatory Visit: Payer: Self-pay | Admitting: Cardiovascular Disease

## 2017-03-04 ENCOUNTER — Telehealth: Payer: Self-pay | Admitting: Cardiovascular Disease

## 2017-03-04 MED ORDER — METOPROLOL SUCCINATE ER 50 MG PO TB24: ORAL_TABLET | ORAL | 6 refills | Status: DC

## 2017-03-04 NOTE — Telephone Encounter (Signed)
New Message ° ° pt verbalized that she is returning call for rn °

## 2017-03-04 NOTE — Telephone Encounter (Signed)
I spoke with pt. She [facility] [address] 30 day supply.  Will send in.

## 2017-04-23 ENCOUNTER — Telehealth: Payer: Self-pay | Admitting: Cardiovascular Disease

## 2017-04-23 MED ORDER — LISINOPRIL 2.5 MG PO TABS: 2.5000 mg | ORAL_TABLET | Freq: Every day | ORAL | 2 refills | Status: DC

## 2017-04-23 MED ORDER — PRASUGREL HCL 10 MG PO TABS: 10.0000 mg | ORAL_TABLET | Freq: Every day | ORAL | 2 refills | Status: DC

## 2017-04-23 NOTE — Telephone Encounter (Signed)
Pt's medication was sent to pt's pharmacy as requested. Confirmation received.  °

## 2017-04-23 NOTE — Telephone Encounter (Signed)
°*  STAT* If patient is at the pharmacy, call can be transferred to refill team.   1. Which medications need to be refilled? (please list name of each medication and dose if known) Effient and Lisinopril ( Needs New Prescription Sent )   2. Which pharmacy/location (including street and city if local pharmacy) is medication to be sent to?[facility] at [address] [place] [address] [phone]#[phone]  3. Do they need a 30 day or 90 day supply? 30  Patient moved and Switch Pharmacy

## 2017-08-04 ENCOUNTER — Other Ambulatory Visit: Payer: Self-pay | Admitting: Cardiovascular Disease

## 2017-08-07 ENCOUNTER — Ambulatory Visit: Payer: BLUE CROSS/BLUE SHIELD | Admitting: Cardiovascular Disease

## 2017-10-10 ENCOUNTER — Other Ambulatory Visit: Payer: Self-pay | Admitting: Cardiovascular Disease

## 2017-10-12 NOTE — Telephone Encounter (Signed)
Medication Detail    Disp Refills Start End   metoprolol succinate (TOPROL-XL) 50 MG 24 hr tablet 30 tablet 3 08/05/2017    Sig: TAKE 1 TABLET BY MOUTH EVERY DAY WITH OR IMMEDIATELY AFTER A MEAL   Sent to pharmacy as: metoprolol succinate (TOPROL-XL) 50 MG 24 hr tablet   Notes to Pharmacy: Must keep appt scheduled in March for additional refills.(08/05/17)   E-Prescribing Status: Receipt confirmed by pharmacy (08/05/2017 1:12 PM EST)   Pharmacy   [facility] [id number] - [place], [place] - [address] AT [facility] [address]

## 2017-10-13 ENCOUNTER — Other Ambulatory Visit: Payer: Self-pay | Admitting: Cardiovascular Disease

## 2017-10-22 NOTE — Progress Notes (Signed)
Cardiology Office Note:    Date:  10/23/2017   ID:  Shannon [name], DOB [birth year], MRN [id number]  PCP:  Patient, No Pcp Per  Cardiologist:  Lauree Chandler, MD   Referring MD: No ref. provider found   Chief Complaint  Patient presents with  . Follow-up    CAD, CHF    History of Present Illness:    Shannon [name] is a 47 y.o. female with coronary artery disease, ischemic cardiomyopathy, hyperlipidemia, peripheral artery disease status post prior carotid/subclavian artery bypass, Horner syndrome.  She presented in July 2015 with an anterior ST elevation myocardial infarction.  Her LAD was occluded and this was treated with 2 overlapping drug-eluting stents.  Post catheterization, she had acute stent thrombosis requiring thrombectomy.  Most recent echocardiogram in March 2017 demonstrated EF 40-45%.  She was last seen by Dr. Angelena Form 01/2017.  Ms. Schweizer returns for follow-up.  She is here today with her husband.  They have moved to [place], [place].  She is interested in being established with a cardiologist in [place].  Since last seen, she has done well.  She denies substernal chest discomfort.  She does have some lower rib discomfort that seems to be associated with positional changes.  She denies significant shortness of breath, PND or edema.  She denies syncope.  She denies any left upper extremity discomfort or subclavian steal symptoms.    Prior CV studies:   The following studies were reviewed today:  Echo 11/23/15 EF 40-45, inferior and inferoseptal akinesis, anteroseptal akinesis, lateral hypokinesis, grade 2 diastolic dysfunction, trivial MR  Echo 06/16/14 Mild focal basal septal hypertrophy, EF 35-40, anteroseptal and lateral akinesis, grade 1 diastolic dysfunction  Echo 03/14/14 Mild LVH, EF 40-45, anteroseptal and apical hypokinesis, lateral hypokinesis, grade 1 diastolic dysfunction  Emergent PCI 03/13/14 LAD mid stent 100 PCI: Successful PTCA of the LAD stented  segment following IVUS guidance, thrombectomy LAD  Cardiac catheterization 03/13/14 Left main 30 LAD proximal 30, mid occluded; diagonal ostial 50 LCx mid 40 RCA proximal 50, mid 30 EF 40, anterior and apical hypokinesis PCI: 2.25 x 22 mm Resolute DES and 2.5 x 26 mm Resolute DES (overlapping) to the mid LAD  Past Medical History:  Diagnosis Date  . Anxiety   . CAD (coronary artery disease)    a. Anterior STEMI 02/2014 s/p PTCA/DESx2 to mLAD, residual moderate LAD disease - recurrent pain/ST elevation with emergent re-cath with acute stent thrombosis s/p PTCA, IVUS, thrombectomy of LAD.   Marland Kitchen Dyslipidemia   . Former tobacco use   . Horner's syndrome   . Ischemic cardiomyopathy   . NSVT (nonsustained ventricular tachycardia) ([place])   . Subclavian artery disease ([place])    a. H/o subclavian artery stenosis s/p carotid/subclavian bypass.    Past Surgical History:  Procedure Laterality Date  . ABDOMINAL HYSTERECTOMY    . CAROTID ARTERY - SUBCLAVIAN ARTERY BYPASS GRAFT    . CHOLECYSTECTOMY    . LEFT HEART CATHETERIZATION WITH CORONARY ANGIOGRAM N/A 03/13/2014   Procedure: LEFT HEART CATHETERIZATION WITH CORONARY ANGIOGRAM;  Surgeon: Burnell Blanks, MD;  Location: [facility];  Service: Cardiovascular;  Laterality: N/A;    Current Medications: Current Meds  Medication Sig  . aspirin 81 MG chewable tablet Chew 1 tablet (81 mg total) by mouth daily.  . Coenzyme Q10 (CO Q 10 PO) TAKE ONE TABLET BY MOUTH DAILY (OTC)  . Lactobacillus-Inulin (PROBIOTIC DIGESTIVE SUPPORT PO) Take 2 capsules by mouth daily.  Marland Kitchen lisinopril (PRINIVIL,ZESTRIL) 2.5 MG tablet  Take 1 tablet (2.5 mg total) by mouth daily.  . metoprolol succinate (TOPROL-XL) 50 MG 24 hr tablet TAKE 1 TABLET BY MOUTH ONCE DAILY WITH  OR  IMMEDIATELY  AFTER  A  MEAL  . nitroGLYCERIN (NITROSTAT) 0.4 MG SL tablet Place 1 tablet (0.4 mg total) under the tongue every 5 (five) minutes as needed for chest pain.  Marland Kitchen oxyCODONE-acetaminophen  (PERCOCET/ROXICET) 5-325 MG per tablet Take 1 tablet by mouth 2 (two) times daily. Two daily by mouth for pain  . prasugrel (EFFIENT) 10 MG TABS tablet Take 1 tablet (10 mg total) by mouth daily.  . pravastatin (PRAVACHOL) 40 MG tablet TAKE 1 TABLET BY MOUTH EVERY EVENING     Allergies:   Lipitor [atorvastatin]   Social History   Tobacco Use  . Smoking status: Former Smoker    Packs/day: 1.00    Years: 25.00    Pack years: 25.00    Types: Cigarettes    Last attempt to quit: 03/13/2014    Years since quitting: 3.6  . Smokeless tobacco: Current User  . Tobacco comment: vapor   Substance Use Topics  . Alcohol use: No  . Drug use: No     Family Hx: The patient's family history includes CAD in her father; Heart attack in her maternal grandfather and mother; Hypertension in her maternal grandfather, maternal grandmother, and mother; Stroke in her maternal grandmother.  ROS:   Please see the history of present illness.    ROS All other systems reviewed and are negative.   EKGs/Labs/Other Test Reviewed:    EKG:  EKG is  ordered today.  The ekg ordered today demonstrates normal sinus rhythm, heart rate 63, normal axis, QTC 456 ms  Recent Labs: 10/29/2016: ALT 14; BUN 8; Creatinine, Ser 0.57; Hemoglobin 12.8; Platelets 343; Potassium 4.9; Sodium 141   Recent Lipid Panel Lab Results  Component Value Date/Time   CHOL 161 10/29/2016 11:30 AM   TRIG 147 10/29/2016 11:30 AM   HDL 47 10/29/2016 11:30 AM   CHOLHDL 3.4 10/29/2016 11:30 AM   CHOLHDL 4.3 11/23/2015 10:11 AM   LDLCALC 85 10/29/2016 11:30 AM   LDLDIRECT 134.0 04/04/2015 10:49 AM    Physical Exam:    VS:  BP 100/78   Pulse 63   Ht 5' 3"  (1.6 m)   Wt 146 lb 12.8 oz (66.6 kg)   SpO2 94%   BMI 26.00 kg/m     Wt Readings from Last 3 Encounters:  10/23/17 146 lb 12.8 oz (66.6 kg)  02/16/17 140 lb (63.5 kg)  08/19/16 143 lb 1.9 oz (64.9 kg)     Physical Exam  Constitutional: She is oriented to person, place, and  time. She appears well-developed and well-nourished. No distress.  HENT:  Head: Normocephalic and atraumatic.  Eyes: No scleral icterus.  Neck: No JVD present. Carotid bruit is not present.  Cardiovascular: Normal rate and regular rhythm.  No murmur heard. No subclavian bruit bilaterally; radial pulses intact bilaterally  Pulmonary/Chest: Effort normal. She has no rales.  Abdominal: Soft.  Musculoskeletal: She exhibits no edema.  Neurological: She is alert and oriented to person, place, and time.  Skin: Skin is warm and dry.    ASSESSMENT & PLAN:    #1.  Coronary artery disease History of anterior ST elevation myocardial infarction in 2015 treated with 2 overlapping DES to the LAD complicated by acute stent thrombosis requiring emergent angioplasty.  She has remained on long-term dual antiplatelet therapy.  She denies any symptoms  of angina.  Continue aspirin, Prasugrel, beta-blocker, ACE inhibitor, pravastatin.  #2.  Ischemic cardiomyopathy EF 40-45.  NYHA 2.  Volume status is stable.  She does not take any diuretic therapy.  Continue lisinopril, metoprolol.  Blood pressure will not tolerate any further adjustments in therapy.  #3.  Dyslipidemia  Continue pravastatin.  - Plan: Lipid Profile, Comp Met (CMET)  #4.  Subclavian artery disease (HCC) Status post prior carotid to subclavian bypass in 2010 by Dr. Trula Slade.  She does not have any symptoms consistent with subclavian steal.  Continue aspirin, pravastatin.  Dispo:  Return in about 6 months (around 04/25/2018) for Routine Follow Up with Dr. Angelena Form.  I will review with Dr. Angelena Form regarding a cardiologist he may recommend in [place] she may also continue to follow-up here once a year.   Medication Adjustments/Labs and Tests Ordered: Current medicines are reviewed at length with the patient today.  Concerns regarding medicines are outlined above.  Tests Ordered: Orders Placed This Encounter  Procedures  . Lipid Profile  .  Comp Met (CMET)  . EKG 12-Lead   Medication Changes: No orders of the defined types were placed in this encounter.   Signed, Richardson Dopp, PA-C  10/23/2017 9:42 AM    [facility] [place], [place], [place]  [address] Phone: [phone]; Fax: [phone]

## 2017-10-23 ENCOUNTER — Ambulatory Visit: Payer: BLUE CROSS/BLUE SHIELD | Admitting: Cardiovascular Disease

## 2017-10-23 ENCOUNTER — Encounter: Payer: Self-pay | Admitting: Physician Assistant

## 2017-10-23 ENCOUNTER — Ambulatory Visit (INDEPENDENT_AMBULATORY_CARE_PROVIDER_SITE_OTHER): Payer: 59 | Admitting: Physician Assistant

## 2017-10-23 VITALS — BP 100/78 | HR 63 | Ht 63.0 in | Wt 146.8 lb

## 2017-10-23 DIAGNOSIS — I2510 Atherosclerotic heart disease of native coronary artery without angina pectoris: Secondary | ICD-10-CM

## 2017-10-23 DIAGNOSIS — I779 Disorder of arteries and arterioles, unspecified: Secondary | ICD-10-CM | POA: Diagnosis not present

## 2017-10-23 DIAGNOSIS — E785 Hyperlipidemia, unspecified: Secondary | ICD-10-CM | POA: Diagnosis not present

## 2017-10-23 DIAGNOSIS — I255 Ischemic cardiomyopathy: Secondary | ICD-10-CM

## 2017-10-23 DIAGNOSIS — I739 Peripheral vascular disease, unspecified: Secondary | ICD-10-CM

## 2017-10-23 LAB — LIPID PANEL
CHOL/HDL RATIO: 3.7 ratio (ref 0.0–4.4)
CHOLESTEROL TOTAL: 173 mg/dL (ref 100–199)
HDL: 47 mg/dL (ref >39)
LDL CALC: 99 mg/dL (ref 0–99)
TRIGLYCERIDES: 134 mg/dL (ref 0–149)
VLDL CHOLESTEROL CAL: 27 mg/dL (ref 5–40)

## 2017-10-23 LAB — COMPREHENSIVE METABOLIC PANEL
ALT: 10 IU/L (ref 0–32)
AST: 13 IU/L (ref 0–40)
Albumin/Globulin Ratio: 2.1 (ref 1.2–2.2)
Albumin: 4.6 g/dL (ref 3.5–5.5)
Alkaline Phosphatase: 89 IU/L (ref 39–117)
BILIRUBIN TOTAL: 0.2 mg/dL (ref 0.0–1.2)
BUN/Creatinine Ratio: 14 (ref 9–23)
BUN: 9 mg/dL (ref 6–24)
CHLORIDE: 104 mmol/L (ref 96–106)
CO2: 24 mmol/L (ref 20–29)
Calcium: 9.4 mg/dL (ref 8.7–10.2)
Creatinine, Ser: 0.65 mg/dL (ref 0.57–1.00)
GFR, EST AFRICAN AMERICAN: 122 mL/min/{1.73_m2} (ref >59)
GFR, EST NON AFRICAN AMERICAN: 106 mL/min/{1.73_m2} (ref >59)
GLOBULIN, TOTAL: 2.2 g/dL (ref 1.5–4.5)
Glucose: 109 mg/dL — ABNORMAL HIGH (ref 65–99)
POTASSIUM: 4.2 mmol/L (ref 3.5–5.2)
SODIUM: 142 mmol/L (ref 134–144)
Total Protein: 6.8 g/dL (ref 6.0–8.5)

## 2017-10-23 NOTE — Patient Instructions (Signed)
Medication Instructions:  1. Your physician recommends that you continue on your current medications as directed. Please refer to the Current Medication list given to you today.   Labwork: TODAY CMET, LIPIDS   Testing/Procedures: NONE ORDERED TODAY  Follow-Up: Your physician wants you to follow-up in: 6 MONTHS WITH DR. Clifton JamesMCALHANY You will receive a reminder letter in the mail two months in advance. If you don't receive a letter, please call our office to schedule the follow-up appointment.   Any Other Special Instructions Will Be Listed Below (If Applicable).     If you need a refill on your cardiac medications before your next appointment, please call your pharmacy.

## 2017-10-27 ENCOUNTER — Telehealth: Payer: Self-pay | Admitting: *Deleted

## 2017-10-27 NOTE — Telephone Encounter (Signed)
I have tried several times to reach the pt though her line is busy. [facility].

## 2017-10-27 NOTE — Telephone Encounter (Signed)
-----   Message from Beatrice LecherScott T Weaver, PA-C sent at 10/23/2017  4:57 PM EST ----- Renal function, potassium, LFTs normal.  Lipids ok. Continue current medications and follow up as planned.  Tereso NewcomerScott Weaver, PA-C    10/23/2017 4:56 PM

## 2017-11-13 ENCOUNTER — Telehealth: Payer: Self-pay | Admitting: Physician Assistant

## 2017-11-13 NOTE — Telephone Encounter (Signed)
Please tell Ms. Shannon [name] that I discussed with Dr. Verne Carrowhristopher McAlhany.  He does not know of a Development worker, international aid[place]iologist [place].  There are several groups there and they have a great hospital system.  She should be fine with whomever she chooses.   Tereso NewcomerScott Shanena Pellegrino, PA-C    11/13/2017 8:12 AM

## 2017-11-13 NOTE — Telephone Encounter (Signed)
Tried to reach pt, line was busy. Called per Tereso NewcomerScott Weaver, PA to please tell Ms. Shannon [name] that I discussed with Dr. Verne Carrowhristopher McAlhany.  He does not know of a Development worker, international aidCardiologist [place].  There are several groups there and they have a great hospital system.  She should be fine with whomever she chooses.   Tereso NewcomerScott Weaver, PA-C    11/13/2017 8:12 AM

## 2017-11-13 NOTE — Telephone Encounter (Signed)
Tried x 2 to reach pt. Line busy..Marland Kitchen

## 2018-01-17 ENCOUNTER — Other Ambulatory Visit: Payer: Self-pay | Admitting: Cardiovascular Disease

## 2018-01-29 ENCOUNTER — Other Ambulatory Visit: Payer: Self-pay | Admitting: Cardiovascular Disease

## 2018-02-07 ENCOUNTER — Other Ambulatory Visit: Payer: Self-pay | Admitting: Cardiovascular Disease

## 2018-11-09 ENCOUNTER — Other Ambulatory Visit: Payer: Self-pay | Admitting: Cardiovascular Disease

## 2019-02-09 ENCOUNTER — Other Ambulatory Visit: Payer: Self-pay | Admitting: Cardiovascular Disease

## 2019-02-25 ENCOUNTER — Other Ambulatory Visit: Payer: Self-pay | Admitting: Cardiovascular Disease

## 2019-03-13 ENCOUNTER — Other Ambulatory Visit: Payer: Self-pay | Admitting: Cardiovascular Disease

## 2019-03-30 ENCOUNTER — Other Ambulatory Visit: Payer: Self-pay

## 2019-03-30 MED ORDER — LISINOPRIL 2.5 MG PO TABS: 2.5000 mg | ORAL_TABLET | Freq: Every day | ORAL | 0 refills | Status: DC

## 2019-04-11 ENCOUNTER — Telehealth: Payer: Self-pay | Admitting: *Deleted

## 2019-04-11 NOTE — Telephone Encounter (Signed)
Left pt a message to call back re: appt 9/21 being changed to 9/22 at same time 1:30. If pt calls back, let her know we have had to change to a virtual appt and get consent.

## 2019-05-03 ENCOUNTER — Other Ambulatory Visit: Payer: Self-pay | Admitting: Cardiovascular Disease

## 2019-05-16 ENCOUNTER — Ambulatory Visit: Payer: 59 | Admitting: Physician Assistant

## 2019-05-16 NOTE — Progress Notes (Signed)
Virtual Visit via Video Note   This visit type was conducted due to national recommendations for restrictions regarding the COVID-19 Pandemic (e.g. social distancing) in an effort to limit this patient's exposure and mitigate transmission in our community.  Due to her co-morbid illnesses, this patient is at least at moderate risk for complications without adequate follow up.  This format is felt to be most appropriate for this patient at this time.  All issues noted in this document were discussed and addressed.  A limited physical exam was performed with this format.  Please refer to the patient's chart for her consent to telehealth for [facility].   Date:  05/17/2019   ID:  Shannon [name], DOB 12/29/1970, MRN [id number]  Patient Location: Home Provider Location: Home  PCP:  Patient, No Pcp Per  Cardiologist:  Verne Carrowhristopher McAlhany, MD  Electrophysiologist:  None   Evaluation Performed:  Follow-Up Visit  Chief Complaint:  F/u CAD, ICM  History of Present Illness:    Shannon [name] is a 48 y.o. female with anterior STEMI 02/2014 s/p DESx2 to LAD c/b acute stent thrombosis shortly after requiring repeat PTCA/thrombectomy, ICM EF 40-45%, Horner's syndrome (with chronically unequal pupils), anxiety, prior tobacco abuse, dyslipidemia (intolerance to Lipitor), prior carotid to subclavian bypass for stenosis in 2010 by Dr. Myra GianottiBrabham, musculoskeletal shoulder pain who presents for routine follow-up virtually. Her last echo was reviewed from 10/2015: EF 40-45% (slightly improved), multiple WMA, grade 2 DD. Last labs 10/2017 showed normal CMET except glucose 109, LDL 99, 2018 CBC wnl.  [place] moved to [place] 2 years ago and has not yet established with cardiology there, so maintains continuity and follow-up here with our team during her transition. She eagerly shares she is doing great from a cardiac standpoint without any chest pain, SOB, palpitations, syncope, orthponea, LEE. Her blood pressure cuff  is not working today. No hemodynamically significant bleeding reported. 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 She may begin working later this month depending on government rules. In the meantime she walks her dog regularly without anginal symptoms. She does report she stopped her pravastatin due to muscle cramping a while back.   The patient does not have symptoms concerning for COVID-19 infection (fever, chills, cough, or new shortness of breath).    Past Medical History:  Diagnosis Date  . Anxiety   . CAD (coronary artery disease)    a. Anterior STEMI 02/2014 s/p PTCA/DESx2 to mLAD, residual moderate LAD disease - recurrent pain/ST elevation with emergent re-cath with acute stent thrombosis s/p PTCA, IVUS, thrombectomy of LAD.   Marland Kitchen. Dyslipidemia   . Former tobacco use   . Horner's syndrome   . Ischemic cardiomyopathy   . NSVT (nonsustained ventricular tachycardia) (HCC)   . Subclavian artery disease ([facility])    a. H/o subclavian artery stenosis s/p carotid/subclavian bypass.   Past Surgical History:  Procedure Laterality Date  . ABDOMINAL HYSTERECTOMY    . CAROTID ARTERY - SUBCLAVIAN ARTERY BYPASS GRAFT    . CHOLECYSTECTOMY    . LEFT HEART CATHETERIZATION WITH CORONARY ANGIOGRAM N/A 03/13/2014   Procedure: LEFT HEART CATHETERIZATION WITH CORONARY ANGIOGRAM;  Surgeon: Kathleene Hazelhristopher D McAlhany, MD;  [facility];  Service: Cardiovascular;  Laterality: N/A;     Current Meds  Medication Sig  . aspirin 81 MG chewable tablet Chew 1 tablet (81 mg total) by mouth daily.  . Coenzyme Q10 (CO Q 10 PO) TAKE  ONE TABLET BY MOUTH DAILY (OTC)  . cyclobenzaprine (FLEXERIL) 10 MG tablet Take 10 mg by mouth as needed.  Marland Kitchen lisinopril (ZESTRIL) 2.5 MG tablet Take 1 tablet (2.5 mg total) by mouth daily.  . metoprolol succinate (TOPROL-XL) 50 MG 24 hr tablet TAKE 1 TABLET BY MOUTH ONCE DAILY WITH OR IMMEDIATLEY AFTER A MEAL. Please make annual appt  for future refills. Thank you. 1st attempt  . nitroGLYCERIN (NITROSTAT) 0.4 MG SL tablet Place 1 tablet (0.4 mg total) under the tongue every 5 (five) minutes as needed for chest pain.  Marland Kitchen oxyCODONE-acetaminophen (PERCOCET/ROXICET) 5-325 MG per tablet Take 1 tablet by mouth 2 (two) times daily. Two daily by mouth for pain  . prasugrel (EFFIENT) 10 MG TABS tablet Take 1 tablet (10 mg total) by mouth daily. Please make annual appt for future refills. Thank you. 1st attempt     Allergies:   Lipitor [atorvastatin]   Social History   Tobacco Use  . Smoking status: Former Smoker    Packs/day: 1.00    Years: 25.00    Pack years: 25.00    Types: Cigarettes    Quit date: 03/13/2014    Years since quitting: 5.1  . Smokeless tobacco: Current User  . Tobacco comment: vapor   Substance Use Topics  . Alcohol use: No  . Drug use: No     Family Hx: The patient's family history includes CAD in her father; Heart attack in her maternal grandfather and mother; Hypertension in her maternal grandfather, maternal grandmother, and mother; Stroke in her maternal grandmother.  ROS:   Please see the history of present illness.    All other systems reviewed and are negative.   Prior CV studies:    Most recent pertinent cardiac studies are outlined above.  Labs/Other Tests and Data Reviewed:    EKG:  An ECG dated 10/23/17 was personally reviewed today and demonstrated:  normal sinus rhythm, heart rate 63, normal axis, nonspecific STT changes, QTC 456 ms  Recent Labs: No results found for requested labs within last 8760 hours.   Recent Lipid Panel Lab Results  Component Value Date/Time   CHOL 173 10/23/2017 09:51 AM   TRIG 134 10/23/2017 09:51 AM   HDL 47 10/23/2017 09:51 AM   CHOLHDL 3.7 10/23/2017 09:51 AM   CHOLHDL 4.3 11/23/2015 10:11 AM   LDLCALC 99 10/23/2017 09:51 AM   LDLDIRECT 134.0 04/04/2015 10:49 AM    Wt Readings from Last 3 Encounters:  05/17/19 145 lb (65.8 kg)  10/23/17 146 lb  12.8 oz (66.6 kg)  02/16/17 140 lb (63.5 kg)     Objective:    Vital Signs:  Ht 5\' 3"  (1.6 m)   Wt 145 lb (65.8 kg)   BMI 25.69 kg/m    VS reviewed. General -  Pleasant WF in no acute distress HEENT - NCAT, EOM intact Pulm - No labored breathing, no coughing during visit, no audible wheezing, speaking in full sentences Neuro - A+Ox3, no slurred speech, answers questions appropriately Psych - Pleasant affect     ASSESSMENT & PLAN:    1. CAD - doing great clinically. She has been maintained on chronic DAPT given her prior stent thrombosis. She denies any bleeding issues. She is due for updated labs. I will reach out to my assistant to see if she can help coordinate getting updated CBC, CMET and lipid profile at a [facility] which the patient states is nearby her house in [place], [place] which is [place] outside [place]. Otherwise  continue present regimen and see below regarding statin. 2. Ischemic cardiomyopathy - clinically she has not manifested any signs of CHF. Her BP has been traditionally too low to advance her regimen further. Reviewed 2g sodium restriction, 2L fluid restriction, daily weights with patient. She will check the batteries in her BP cuff and was instructed to send [place] in her BP and pulse when she gets it working. 3. Dyslipidemia - intolerant of atorvastatin and pravastatin. Pravastatin was not getting her to goal even at 40mg  dose so doubt 20mg  dose is worth trying. Will try a trial of low dose Crestor 5mg  daily to start. If she tolerates this, there may be room to advance it - but if she does not tolerate it, she would be headed for discussion about PCSK9. I will await her initial updated labs above to decide when we should recheck lipids/LFTs. 4. Prior subclavian artery disease - no symptoms reported with this. Continue secondary prevention.  COVID-19 Education: The signs and symptoms of COVID-19 were discussed with the patient and how to seek care for testing (follow up with  PCP or arrange E-visit).  The importance of social distancing was discussed today.  Time:   Today, I have spent 20 minutes with the patient with telehealth technology discussing the above problems.     Medication Adjustments/Labs and Tests Ordered: Current medicines are reviewed at length with the patient today.  Concerns regarding medicines are outlined above.   Disposition:  Follow up in 6 months in the office with Dr. Clifton James. She also plans to reach out to family to see if they have any cardiology recommendations for the local [place] area. I will reach out to PA colleagues as well.  Signed, Laurann [place], PA-C  05/17/2019 1:59 PM    [facility]

## 2019-05-17 ENCOUNTER — Other Ambulatory Visit: Payer: Self-pay

## 2019-05-17 ENCOUNTER — Telehealth (INDEPENDENT_AMBULATORY_CARE_PROVIDER_SITE_OTHER): Payer: Self-pay | Admitting: Physician Assistant

## 2019-05-17 ENCOUNTER — Telehealth: Payer: Self-pay

## 2019-05-17 ENCOUNTER — Encounter: Payer: Self-pay | Admitting: Physician Assistant

## 2019-05-17 VITALS — Ht 63.0 in | Wt 145.0 lb

## 2019-05-17 DIAGNOSIS — E785 Hyperlipidemia, unspecified: Secondary | ICD-10-CM

## 2019-05-17 DIAGNOSIS — I2510 Atherosclerotic heart disease of native coronary artery without angina pectoris: Secondary | ICD-10-CM

## 2019-05-17 DIAGNOSIS — I771 Stricture of artery: Secondary | ICD-10-CM

## 2019-05-17 DIAGNOSIS — I255 Ischemic cardiomyopathy: Secondary | ICD-10-CM

## 2019-05-17 MED ORDER — ROSUVASTATIN CALCIUM 5 MG PO TABS: 5.0000 mg | ORAL_TABLET | Freq: Every day | ORAL | 3 refills | Status: DC

## 2019-05-17 NOTE — Telephone Encounter (Signed)
Spoke to pt. Verbal consent was given for video visit on 05/17/19...jb

## 2019-05-17 NOTE — Patient Instructions (Addendum)
Medication Instructions:  Your physician has recommended you make the following change in your medication:  1.  START Crestor 5 mg take 1 tablet daily  If you need a refill on your cardiac medications before your next appointment, please call your pharmacy.   Lab work: FASTING: LIPID/CMET/CBC AT ANY [facility] NEAR YOU ANY DAY YOU CHOSE, YOU DON'T HAVE TO HAVE AN APPT.  I CONFIRMED THIS WITH [facility] IN [place], [place]. JUST MAKE SURE WHEN YOU GO, YOU ARE FASTING, NOTHING TO EAT OR DRINK AFTER MIDNIGHT THE NIGHT BEFORE.  If you have labs (blood work) drawn today and your tests are completely normal, you will receive your results only by: Marland Kitchen MyChart Message (if you have MyChart) OR . A paper copy in the mail If you have any lab test that is abnormal or we need to change your treatment, we will call you to review the results.  Testing/Procedures: None ordered  Follow-Up: At [facility], you and your health needs are our priority.  As part of our continuing mission to provide you with exceptional heart care, we have created designated Provider Care Teams.  These Care Teams include your primary Cardiologist (physician) and Advanced Practice Providers (APPs -  Physician Assistants and Nurse Practitioners) who all work together to provide you with the care you need, when you need it. You will need a follow up appointment in 6 months.  Please call our office 2 months in advance to schedule this appointment.  You may see Lauree Chandler, MD or one of the following Advanced Practice Providers on your designated Care Team:   [place], PA-C Melina Copa, PA-C . Ermalinda Barrios, PA-C  Any Other Special Instructions Will Be Listed Below (If Applicable).

## 2019-05-19 ENCOUNTER — Telehealth: Payer: Self-pay | Admitting: Physician Assistant

## 2019-05-19 NOTE — Telephone Encounter (Signed)
   Please let pt know my search for word of mouth [facility] cardiology recommendations have come up cold (the colleagues from the area have not been back there for 10+ years to know any particular names). But I will keep an ear out and let her know if I come across any. In the meantime it might be as simple as calling her local cardiology office and asking the front desk who they would recommend! Shannon [name]

## 2019-05-19 NOTE — Telephone Encounter (Signed)
Left detailed message for pt re: New Cardiologist in [place]. (See message below) Advised pt if she had further questions, to call back or send a mychart message.

## 2019-06-05 ENCOUNTER — Other Ambulatory Visit: Payer: Self-pay | Admitting: Cardiovascular Disease

## 2019-08-08 ENCOUNTER — Other Ambulatory Visit: Payer: Self-pay | Admitting: Cardiovascular Disease

## 2020-06-21 ENCOUNTER — Other Ambulatory Visit: Payer: Self-pay | Admitting: Cardiovascular Disease

## 2020-09-03 ENCOUNTER — Telehealth: Payer: Self-pay | Admitting: Cardiovascular Disease

## 2020-09-03 ENCOUNTER — Other Ambulatory Visit: Payer: Self-pay | Admitting: Cardiovascular Disease

## 2020-09-03 ENCOUNTER — Telehealth: Payer: Self-pay | Admitting: Physician Assistant

## 2020-09-03 MED ORDER — METOPROLOL SUCCINATE ER 50 MG PO TB24: ORAL_TABLET | ORAL | 0 refills | Status: DC

## 2020-09-03 MED ORDER — PRASUGREL HCL 10 MG PO TABS: 10.0000 mg | ORAL_TABLET | Freq: Every day | ORAL | 0 refills | Status: DC

## 2020-09-03 NOTE — Telephone Encounter (Signed)
Pt c/o medication issue:  1. Name of Medication: prasugrel (EFFIENT) 10 MG TABS tablet  2. How are you currently taking this medication (dosage and times per day)? 10 mg daily  3. Are you having a reaction (difficulty breathing--STAT)? no  4. What is your medication issue? Pharmacy ([facility] [phone] - [place], [place] - [address] AT [address]) called and said that there was a 15 day RX for this medication sent in for the patient. The Pharmacist said the medication comes in 30 day supply boxes that can not be split up. The Pharmacist needs orders to dispense a 30 day supply

## 2020-09-03 NOTE — Telephone Encounter (Signed)
Pt's medication was sent to pt's pharmacy as requested. Confirmation received.  °

## 2020-09-03 NOTE — Telephone Encounter (Signed)
*  STAT* If patient is at the pharmacy, call can be transferred to refill team.   1. Which medications need to be refilled? (please list name of each medication and dose if known)  metoprolol succinate (TOPROL-XL) 50 MG 24 hr tablet  2. Which pharmacy/location (including street and city if local pharmacy) is medication to be sent to? [id number] - [place], [place] - [address] AT [address]  3. Do they need a 30 day or 90 day supply? 90

## 2020-09-03 NOTE — Telephone Encounter (Signed)
Returned call to pt.  She is aware that I will call the [facility] and have the Metoprolol changed to #30, which will take her until her appt 09/26/2020.  Pt verbalized understanding.

## 2020-09-13 ENCOUNTER — Telehealth: Payer: Self-pay | Admitting: *Deleted

## 2020-09-13 NOTE — Telephone Encounter (Signed)
Pt was scheduled as in office appointment with Ronie Spies, PA-C and was supposed to virtual. Switched to [employer].    Patient Consent for Virtual Visit         Shannon [name] has provided verbal consent on 09/13/2020 for a virtual visit (video or telephone).   CONSENT FOR VIRTUAL VISIT FOR:  Shannon [name]  By participating in this virtual visit I agree to the following:  I hereby voluntarily request, consent and authorize [facility] and its employed or contracted physicians, physician assistants, nurse practitioners or other licensed health care professionals (the Practitioner), to provide me with telemedicine health care services (the "Services") as deemed necessary by the treating Practitioner. I acknowledge and consent to receive the Services by the Practitioner via telemedicine. I understand that the telemedicine visit will involve communicating with the Practitioner through live audiovisual communication technology and the disclosure of certain medical information by electronic transmission. I acknowledge that I have been given the opportunity to request an in-person assessment or other available alternative prior to the telemedicine visit and am voluntarily participating in the telemedicine visit.  I understand that I have the right to withhold or withdraw my consent to the use of telemedicine in the course of my care at any time, without affecting my right to future care or treatment, and that the Practitioner or I may terminate the telemedicine visit at any time. I understand that I have the right to inspect all information obtained and/or recorded in the course of the telemedicine visit and may receive copies of available information for a reasonable fee.  I understand that some of the potential risks of receiving the Services via telemedicine include:  Marland Kitchen Delay or interruption in medical evaluation due to technological equipment failure or disruption; . Information transmitted  may not be sufficient (e.g. poor resolution of images) to allow for appropriate medical decision making by the Practitioner; and/or  . In rare instances, security protocols could fail, causing a breach of personal health information.  Furthermore, I acknowledge that it is my responsibility to provide information about my medical history, conditions and care that is complete and accurate to the best of my ability. I acknowledge that Practitioner's advice, recommendations, and/or decision may be based on factors not within their control, such as incomplete or inaccurate data provided by me or distortions of diagnostic images or specimens that may result from electronic transmissions. I understand that the practice of medicine is not an exact science and that Practitioner makes no warranties or guarantees regarding treatment outcomes. I acknowledge that a copy of this consent can be made available to me via my patient portal [facility] MyChart), or I can request a printed copy by calling the office of [facility].    I understand that my insurance will be billed for this visit.   I have read or had this consent read to me. . I understand the contents of this consent, which adequately explains the benefits and risks of the Services being provided via telemedicine.  . I have been provided ample opportunity to ask questions regarding this consent and the Services and have had my questions answered to my satisfaction. . I give my informed consent for the services to be provided through the use of telemedicine in my medical care

## 2020-09-24 NOTE — Progress Notes (Addendum)
Virtual Visit via Telephone Note   This visit type was conducted due to national recommendations for restrictions regarding the COVID-19 Pandemic (e.g. social distancing) in an effort to limit this patient's exposure and mitigate transmission in our community.  Due to her co-morbid illnesses, this patient is at least at moderate risk for complications without adequate follow up.  This format is felt to be most appropriate for this patient at this time.  The patient did not have access to video technology/had technical difficulties with video requiring transitioning to audio format only (telephone).  All issues noted in this document were discussed and addressed.  No physical exam could be performed with this format.  Please refer to the patient's chart for her  consent to telehealth for [facility].   The patient was identified using 2 identifiers.  Date:  09/26/2020   ID:  Shannon [name], DOB [birth year], MRN [id number]  Patient Location: Home Provider Location: Home Office  PCP:  Patient, No Pcp Per  Cardiologist:  Verne Carrow, MD  Electrophysiologist:  None   Evaluation Performed:  Follow-Up Visit  Chief Complaint:  Overdue follow-up, last OV 04/2019 via telemed  History of Present Illness:    Shannon [name] is a 50 y.o. female with anterior STEMI 02/2014 s/p DESx2 to LAD c/b acute stent thrombosis shortly after requiring repeat PTCA/thrombectomy, ICM EF 40-45%, Horner's syndrome (with chronically unequal pupils), anxiety, prior tobacco abuse, dyslipidemia (intolerance to Lipitor), prior carotid to subclavian bypass for stenosis in 2010 by Dr. Myra Gianotti, musculoskeletal shoulder pain who presents for routine follow-up virtually. Her last echo was reviewed from 10/2015: EF 40-45% (slightly improved), multiple WMA, grade 2 DD. She has been maintained on long term DAPT due to prior stent thrombosis.  She moved to [place] 3.5 years ago and has not yet established with cardiology  there, so has maintained continuity with our team during her transition. She last did so via telehealth in 04/2019. She had stopped her pravastatin due to cramping at that time but otherwise was doing well. We started her on a trial of low dose Crestor. Repeat labs requested from [facility] but do not see those ever came through.  She is seen back virtually for follow-up and doing well. She has not yet established with cardiology out there. She did establish with primary care due to Covid-19 infection in September 2021. She reports a relatively mild course but has noticed some generalized fatigue. She is now working in [employer] (used to be a Secondary school teacher). No focal cardiac symptoms reported. She reports compliance with her medicines but in need of refills. Her BP is mildly elevated today but she reports this is due to being startled by the early phone call while sleeping. She states she normally checks it and it runs around 120/80 or less. She did not tolerate Crestor due to myalgias.  Labs Independently Reviewed Last labs 10/2017 showed normal CMET except glucose 109, LDL 99, 2018 CBC wnl.  Past Medical History:  Diagnosis Date  . Anxiety   . CAD (coronary artery disease)    a. Anterior STEMI 02/2014 s/p PTCA/DESx2 to mLAD, residual moderate LAD disease - recurrent pain/ST elevation with emergent re-cath with acute stent thrombosis s/p PTCA, IVUS, thrombectomy of LAD.   Marland Kitchen Dyslipidemia   . Former tobacco use   . Horner's syndrome   . Ischemic cardiomyopathy   . NSVT (nonsustained ventricular tachycardia) (HCC)   . Subclavian artery disease (HCC)    a. H/o  subclavian artery stenosis s/p carotid/subclavian bypass.   Past Surgical History:  Procedure Laterality Date  . ABDOMINAL HYSTERECTOMY    . CAROTID ARTERY - SUBCLAVIAN ARTERY BYPASS GRAFT    . CHOLECYSTECTOMY    . LEFT HEART CATHETERIZATION WITH CORONARY ANGIOGRAM N/A 03/13/2014   Procedure: LEFT HEART CATHETERIZATION WITH CORONARY  ANGIOGRAM;  Surgeon: Kathleene Hazel, MD;  Location: [facility] CATH LAB;  Service: Cardiovascular;  Laterality: N/A;     Current Meds  Medication Sig  . aspirin 81 MG chewable tablet Chew 1 tablet (81 mg total) by mouth daily.  . cyclobenzaprine (FLEXERIL) 10 MG tablet Take 10 mg by mouth as needed.  Marland Kitchen lisinopril (ZESTRIL) 2.5 MG tablet Take 1 tablet by mouth once daily  . metoprolol succinate (TOPROL-XL) 50 MG 24 hr tablet TAKE ONE TABLET BY MOUTH ONE TIME DAILY IMMEDIATELY AFTER MEAL - PLEASE MAKE APPOINTMENT WITH DR Clifton James BEFORE ANYMORE REFILLS. 2nd Attempt  . nitroGLYCERIN (NITROSTAT) 0.4 MG SL tablet Place 1 tablet (0.4 mg total) under the tongue every 5 (five) minutes as needed for chest pain.  Marland Kitchen oxyCODONE-acetaminophen (PERCOCET/ROXICET) 5-325 MG per tablet Take 1 tablet by mouth 2 (two) times daily. Two daily by mouth for pain  . prasugrel (EFFIENT) 10 MG TABS tablet Take 1 tablet (10 mg total) by mouth daily. Please keep upcoming appt in February 2022 before anymore refills. Thank you Final Attempt  . [DISCONTINUED] Coenzyme Q10 (CO Q 10 PO) TAKE ONE TABLET BY MOUTH DAILY (OTC)     Allergies:   Pravastatin and Lipitor [atorvastatin]   Social History   Tobacco Use  . Smoking status: Former Smoker    Packs/day: 1.00    Years: 25.00    Pack years: 25.00    Types: Cigarettes    Quit date: 03/13/2014    Years since quitting: 6.5  . Smokeless tobacco: Current User  . Tobacco comment: vapor   Substance Use Topics  . Alcohol use: No  . Drug use: No     Family Hx: The patient's family history includes CAD in her father; Heart attack in her maternal grandfather and mother; Hypertension in her maternal grandfather, maternal grandmother, and mother; Stroke in her maternal grandmother.  ROS:   Please see the history of present illness.    +Paranosmia All other systems reviewed and are negative.   Prior CV studies:   The following studies were reviewed today:  2D echo  10/2015 - Left ventricle: The cavity size was normal. Systolic function was  mildly to moderately reduced. The estimated ejection fraction was  in the range of 40% to 45%. There is akinesis of the  mid-apicalinferior and inferoseptal myocardium. There is akinesis  of the mid-apicalanteroseptal myocardium. There is severe  hypokinesis of the lateral myocardium. Features are consistent  with a pseudonormal left ventricular filling pattern, with  concomitant abnormal relaxation and increased filling pressure  (grade 2 diastolic dysfunction).  - Mitral valve: There was trivial regurgitation.  02/2014 Procedure Performed:  1. Selective coronary angiography 2. IVUS of the LAD 3. Aspiration thrombectomy mid LAD 4. PTCA of the mid LAD (balloon only with non-compliant balloon)    Operator: Verne Carrow, MD  Access: right radial artery  Indication: 50 yo female admitted this morning with acute anterior STEMI. LAD was occluded. DES x 2 placed in the mid LAD. Pt was being moved out of the cath lab and had onset of 10/10 chest pain. EKG with recurrent ST elevation anterior and inferior leads.  Procedure Details: Emergency consent obtained. Right wrist prepped and draped. 1% lidocaine used for local anesthesia. 5/6 french placed right radial artery. The patient was further sedated with Versed and Fentanyl. Selective angiography of the left coronary system with a XB  LAD 3.5 guiding catheter. The LAD was found to be occluded.   PCI Note: She was given a bolus of Angiomax and a drip was started. She was given a bolus of Aggrastat and a drip was started. I then passed a Cougar IC wire down the LAD. A 2.5 x 12 mm balloon was inflated x 3 in the mid stented segment to restore flow. I then passed a Priority aspiration catheter down the LAD and thrombus was extracted. I then performed an IVUS of the stented segment. No mechanical complications were  seen. The stent was well apposed to the vessel wall but appeared to be underexpanded for the vessel size. I then post-dilated the entire stented segment with a 3.25 x 20 mm [place] balloon x 3. The final angiographic result was excellent. Final IVUS run demonstrated good stent expansion with good apposition to the vessel wall. Flow down the LAD was TIMI-3.  There were no immediate complications. The patient was taken to the recovery area in stable condition. Radial sheath was removed and a Terumo hemostasis band was applied to the radial artery.   Hemodynamic Findings: Central aortic pressure: 134/77  Angiographic findings:   Left main: patent, mild distal disease LAD: 100% mid occlusion in stent Circumflex: patent with mild diffuse plaque  Impression: 1. Acute stent thrombosis of the mid LAD stent 2. Successful PTCA of the LAD stented segment following IVUS guidance, thrombectomy LAD.   Recommendations:  She will be admitted to the CCU. Echo in am. Statin, beta blocker, ASA, Effient. Will run Aggrastat for 18 hours.        Complications:  None; patient tolerated the procedure well.   ------  Cardiac Catheterization Operative Report  Judieth Keensara C Merten [id number] 7/20/201511:57 AM No primary provider on file.  Procedure Performed:  1. Left Heart Catheterization 2. Selective Coronary Angiography 3. Left ventricular angiogram 4. PTCA/DES x 2 mid LAD 5. Angioseal RFA  Operator: Verne Carrowhristopher McAlhany, MD  Indication:   50 yo female with history of tobacco abuse presenting with anterior STEMI.                               Procedure Details: The risks, benefits, complications, treatment options, and expected outcomes were discussed with the patient. Emergency consent obtained. The patient was brought to the cath lab via EMS. The patient was further sedated with Versed and Fentanyl. The right groin was prepped and draped in the usual manner. Using the modified Seldinger access  technique, a 6 French sheath was placed in the right femoral artery. Standard diagnostic catheters were used to perform selective coronary angiography. She was found to have total occlusion of the mid LAD. I elected to perform PCI of the LAD.   PCI Note: The left main was engaged with a XB LAD 3.5 guiding catheter. She was given Effient 60 mg po x 1. She was given a weight based bolus of Angiomax and a drip was started. When the ACT was over 200, I passed a Cougar IC wire into the mid LAD. I could not pass the stenosis. I then used a 2.0 x 12 mm OTW balloon and a long Whisper wire to cross the lesion. The lesion  was pre-dilated x 5. A 2.25 x 22 mm Resolute DES was deployed in the mid LAD. A 2.5 x 26 mm Resolute DES was deployed in the mid LAD overlapping on the proximal segment of the first stent. The distal stented segment was post-dilated with a 2.5 x 20 mm [place] balloon x 2. The proximal stented segment was post-dilated with a 2.75 x 20 mm [place] balloon x 2. The stent did not seem to be well expanded proximally so I used a 2.75 x 12 mm [place] balloon x 2 post-dilate the proximal stented segment. There was an excellent angiographic result with TIMI-3 flow down the LAD.  A pigtail catheter was used to perform a left ventricular angiogram.  There were no immediate complications. The patient was taken to the recovery area in stable condition.   Hemodynamic Findings: Central aortic pressure: 134/78 Left ventricular pressure: 132/9/17  Angiographic Findings:  Left main: 30% distal stenosis.   Left Anterior Descending Artery: Large caliber vessel that courses to the apex. The proximal vessel has diffuse 30% stenosis. There is a moderate caliber diagonal branch with ostial 50% stenosis. The mid LAD is totally occluded after the diagonal branch.   Circumflex Artery: Small to moderate caliber vessel with small Obtuse marginal branch. 40% mid stenosis.   Right Coronary Artery: Large dominant vessel with 50%  proximal stenosis. 30% mid stenosis.  Left Ventricular Angiogram: LVEF=40% with hypokinesis of the anterior wall and apex.   Impression: 1. Acute anterior MI secondary to occluded mid LAD 2. Successful PTCA/DES x 2 mid LAD 3. Moderate disease RCA 4. Moderate segmental LV systolic dysfunction  Recommendations: To CCU. Echo in am. Start beta blocker and statin. Continue ASA, Effient.        Complications:  None. The patient tolerated the procedure well.            Labs/Other Tests and Data Reviewed:    EKG: An ECG dated 10/23/17 was personally reviewed today and demonstrated:  normal sinus rhythm, heart rate 63, normal axis, nonspecific STT changes, QTC 456 ms  Recent Labs: No results found for requested labs within last 8760 hours.   Recent Lipid Panel Lab Results  Component Value Date/Time   CHOL 173 10/23/2017 09:51 AM   TRIG 134 10/23/2017 09:51 AM   HDL 47 10/23/2017 09:51 AM   CHOLHDL 3.7 10/23/2017 09:51 AM   CHOLHDL 4.3 11/23/2015 10:11 AM   LDLCALC 99 10/23/2017 09:51 AM   LDLDIRECT 134.0 04/04/2015 10:49 AM    Wt Readings from Last 3 Encounters:  09/26/20 142 lb (64.4 kg)  05/17/19 145 lb (65.8 kg)  10/23/17 146 lb 12.8 oz (66.6 kg)     Objective:    Vital Signs:  BP (!) 142/86   Pulse 78   Ht 5\' 3"  (1.6 m)   Wt 142 lb (64.4 kg)   BMI 25.15 kg/m    VS reviewed. General - calm F in no acute distress Pulm - No labored breathing, no coughing during visit, no audible wheezing, speaking in full sentences Neuro - A+Ox3, no slurred speech, answers questions appropriately Psych - Pleasant affect  ASSESSMENT & PLAN:    1. CAD - continue ASA, Effient (long term DAPT due to ISR), BB. See below regarding statin. I reinforced the need for her to establish with cardiology locally where she relocated, as it is challenging to continue to adequately care for her only virtually without easy access to labs, EKG or physical examination. Fortunately she has contacts with  someone who has CHF and likes their cardiologist so she is planning on calling their office to establish care. In the meantime we really need to see her labwork in order to be able to continue to fill her medication safely. We'll try to arrange for her to have CMET, CBC, lipid panel and A1C at her next convenience at the [facility] nearby. She states she will go there on her next day off next week. She states this was previously a limited site with restrictions due to Covid but is now reopen. We'll provide 30 day refill on her cardiac medications and once we receive her updated lab results, can refill for 6 months to give her time to establish local cardiology care. The other option would be welcoming her to return to [facility] for in-person evaluation in the future.  2. Ischemic cardiomyopathy - no symptoms to suggest CHF. Update labs as above. The patient was instructed to monitor their blood pressure at home and to call if tending to run higher than 130/80 at home.  3. Dyslipidemia - did not tolerate atorvastatin, pravastatin or low dose rosuvastatin. Await labs. Once we have them, will plan to refer to lipid clinic for virtual visit to consider PCSK9 inhibitors.  4. Prior subclavian artery disease - no symptoms to suggest steal. Continue with risk factor modification as planned above.  5. History of Covid-19 infection - had relatively mild course in 04/2020, but does note some residual fatigue. She saw primary care acutely around the time of diagnosis but has not had follow-up. She also is having some paranosmia with smelling things as sweet when they should smell bad. It is not clear whether perhaps this is a sequalae of her Covid or a totally unrelated issue. Since she is no longer local to our area, I recommended she get back in for follow-up for evaluation. Will also await labs as above. We discussed recommendation of the Covid-19 vaccination for our cardiac patients and provided opportunity to ask  questions. She is not interested at this time but knows that we are available for any questions going forward.   Time:   Today, I have spent 22 minutes with the patient with telehealth technology discussing the above problems.     Medication Adjustments/Labs and Tests Ordered: Current medicines are reviewed at length with the patient today.  Testing and concerns regarding medicines are outlined above.    Follow Up:  Virtually in 6 weeks to ensure adequate follow-up plan  Signed, Laurann Montana, PA-C  09/26/2020 10:21 AM    [facility]

## 2020-09-26 ENCOUNTER — Telehealth (INDEPENDENT_AMBULATORY_CARE_PROVIDER_SITE_OTHER): Payer: BC Managed Care – PPO | Admitting: Physician Assistant

## 2020-09-26 ENCOUNTER — Encounter: Payer: Self-pay | Admitting: Physician Assistant

## 2020-09-26 ENCOUNTER — Other Ambulatory Visit: Payer: Self-pay | Admitting: *Deleted

## 2020-09-26 ENCOUNTER — Other Ambulatory Visit: Payer: Self-pay

## 2020-09-26 VITALS — BP 142/86 | HR 78 | Ht 63.0 in | Wt 142.0 lb

## 2020-09-26 DIAGNOSIS — E785 Hyperlipidemia, unspecified: Secondary | ICD-10-CM | POA: Diagnosis not present

## 2020-09-26 DIAGNOSIS — I739 Peripheral vascular disease, unspecified: Secondary | ICD-10-CM

## 2020-09-26 DIAGNOSIS — I255 Ischemic cardiomyopathy: Secondary | ICD-10-CM

## 2020-09-26 DIAGNOSIS — Z8616 Personal history of COVID-19: Secondary | ICD-10-CM

## 2020-09-26 DIAGNOSIS — I2510 Atherosclerotic heart disease of native coronary artery without angina pectoris: Secondary | ICD-10-CM

## 2020-09-26 MED ORDER — LISINOPRIL 2.5 MG PO TABS: 2.5000 mg | ORAL_TABLET | Freq: Every day | ORAL | 0 refills | Status: DC

## 2020-09-26 MED ORDER — METOPROLOL SUCCINATE ER 50 MG PO TB24: ORAL_TABLET | ORAL | 0 refills | Status: DC

## 2020-09-26 MED ORDER — NITROGLYCERIN 0.4 MG SL SUBL: 0.4000 mg | SUBLINGUAL_TABLET | SUBLINGUAL | 0 refills | Status: AC | PRN

## 2020-09-26 MED ORDER — PRASUGREL HCL 10 MG PO TABS: 10.0000 mg | ORAL_TABLET | Freq: Every day | ORAL | 0 refills | Status: DC

## 2020-09-26 NOTE — Patient Instructions (Addendum)
Medication Instructions:  Your physician recommends that you continue on your current medications as directed. Please refer to the Current Medication list given to you today.  *If you need a refill on your cardiac medications before your next appointment, please call your pharmacy*   Lab Work: Go to your nearest [facility] and get the following labs within the next week:  CBC, CMET, LIPID, & A1C We can only fill one month of medications until this lab work is done. If you have labs (blood work) drawn today and your tests are completely normal, you will receive your results only by: Marland Kitchen MyChart Message (if you have MyChart) OR . A paper copy in the mail If you have any lab test that is abnormal or we need to change your treatment, we will call you to review the results.   Testing/Procedures: None ordered   Follow-Up: At [facility], you and your health needs are our priority.  As part of our continuing mission to provide you with exceptional heart care, we have created designated Provider Care Teams.  These Care Teams include your primary Cardiologist (physician) and Advanced Practice Providers (APPs -  Physician Assistants and Nurse Practitioners) who all work together to provide you with the care you need, when you need it.  We recommend signing up for the patient portal called "MyChart".  Sign up information is provided on this After Visit Summary.  MyChart is used to connect with patients for Virtual Visits (Telemedicine).  Patients are able to view lab/test results, encounter notes, upcoming appointments, etc.  Non-urgent messages can be sent to your provider as well.   To learn more about what you can do with MyChart, go to [email or web address].    Your next appointment:   6 week(s)   11/07/2020 3:15  The format for your next appointment:   Virtual Visit   Provider:   Ronie Spies, PA-C   Other Instructions  Please monitor your blood pressure occasionally at home. Call your  doctor if you tend to get readings of greater than 130 on the top number or 80 on the bottom number.   Please contact your primary care to be seen for your post-Covid fatigue

## 2020-11-06 ENCOUNTER — Other Ambulatory Visit: Payer: Self-pay

## 2020-11-06 ENCOUNTER — Telehealth: Payer: Self-pay | Admitting: *Deleted

## 2020-11-06 ENCOUNTER — Encounter: Payer: Self-pay | Admitting: *Deleted

## 2020-11-06 MED ORDER — LISINOPRIL 2.5 MG PO TABS: 2.5000 mg | ORAL_TABLET | Freq: Every day | ORAL | 0 refills | Status: DC

## 2020-11-06 NOTE — Telephone Encounter (Signed)
Call placed to pt regarding staff message below. Left  Message for pt to call back.

## 2020-11-06 NOTE — Telephone Encounter (Signed)
-----   Message from Laurann Montana, [place] sent at 11/03/2020  2:53 PM EST ----- Regarding: Important: labs Please call [facility] this week. Find out status of labs we ordered. If unable to get results by OV on 3/16 will need to r/s appt.

## 2020-11-06 NOTE — Telephone Encounter (Signed)
On refill request, I authorized refill for #10 but it is imperative to her health and safety that she get the labs done as requested. We cannot provide further refills thereafter since this medicine is dosed based on the health of her kidneys. Taking it without lab check can be detrimental to her health if she has developed any issues that we are unaware of. Other options include going to a local urgent care, walk in clinic, or asking her PCP to draw these labs. Thanks!

## 2020-11-06 NOTE — Telephone Encounter (Signed)
See other mychart msg

## 2020-11-07 ENCOUNTER — Telehealth: Payer: BC Managed Care – PPO | Admitting: Physician Assistant

## 2020-11-07 ENCOUNTER — Other Ambulatory Visit: Payer: Self-pay

## 2020-12-06 ENCOUNTER — Other Ambulatory Visit: Payer: Self-pay | Admitting: Cardiovascular Disease

## 2020-12-06 NOTE — Telephone Encounter (Signed)
Pt's medication was sent to pt's pharmacy as requested. Confirmation received.  °

## 2021-01-23 ENCOUNTER — Telehealth: Payer: Self-pay | Admitting: Cardiovascular Disease

## 2021-01-23 NOTE — Telephone Encounter (Signed)
*  STAT* If patient is at the pharmacy, call can be transferred to refill team.   1. Which medications need to be refilled? (please list name of each medication and dose if known) lisinopril (ZESTRIL) 2.5 MG tablet  2. Which pharmacy/location (including street and city if local pharmacy) is medication to be sent to? lisinopril (ZESTRIL) 2.5 MG tablet  3. Do they need a 30 day or 90 day supply? 90 day supply

## 2021-01-24 ENCOUNTER — Other Ambulatory Visit: Payer: Self-pay

## 2021-01-24 MED ORDER — LISINOPRIL 2.5 MG PO TABS: 2.5000 mg | ORAL_TABLET | Freq: Every day | ORAL | 3 refills | Status: DC

## 2021-01-29 ENCOUNTER — Other Ambulatory Visit: Payer: Self-pay | Admitting: *Deleted

## 2021-01-29 ENCOUNTER — Telehealth: Payer: Self-pay | Admitting: Cardiovascular Disease

## 2021-01-29 MED ORDER — LISINOPRIL 2.5 MG PO TABS: 2.5000 mg | ORAL_TABLET | Freq: Every day | ORAL | 1 refills | Status: DC

## 2021-01-29 NOTE — Telephone Encounter (Signed)
*  STAT* If patient is at the pharmacy, call can be transferred to refill team.   1. Which medications need to be refilled? (please list name of each medication and dose if known) lisinopril (ZESTRIL) 2.5 MG tablet  2. Which pharmacy/location (including street and city if local pharmacy) is medication to be sent to? [id number] [place] - [place], [place] - [address] AT [address]  3. Do they need a 30 day or 90 day supply? 90 day supply

## 2021-07-11 ENCOUNTER — Telehealth: Payer: Self-pay | Admitting: Cardiovascular Disease

## 2021-07-11 NOTE — Telephone Encounter (Signed)
Pt c/o medication issue:  1. Name of Medication: prasugrel (EFFIENT) 10 MG TABS tablet  2. How are you currently taking this medication (dosage and times per day)? Take 1 tablet (10 mg total) by mouth daily  3. Are you having a reaction (difficulty breathing--STAT)? no  4. What is your medication issue? Was calling to get [place] opioion on her stopping medication. The dr that she see in [place] told her she can stop. Please advise

## 2021-07-11 NOTE — Telephone Encounter (Signed)
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 [name].   Gave patient advisement. She verbalized understanding and will restart the medication. Asked that we call Dr. Sherilyn Cooter office in [place] with this information.

## 2021-07-11 NOTE — Telephone Encounter (Signed)
Give Dr. Michaelle Copas office information at the patients request.

## 2021-12-21 ENCOUNTER — Other Ambulatory Visit: Payer: Self-pay | Admitting: Physician Assistant

## 2022-02-18 ENCOUNTER — Other Ambulatory Visit: Payer: Self-pay | Admitting: Cardiovascular Disease

## 2022-03-24 ENCOUNTER — Other Ambulatory Visit: Payer: Self-pay | Admitting: Cardiovascular Disease

## 2022-04-09 ENCOUNTER — Other Ambulatory Visit: Payer: Self-pay | Admitting: Cardiovascular Disease
